# Patient Record
Sex: Male | Born: 1945 | Race: White | Hispanic: No | Marital: Married | Smoking: Never smoker
Health system: Southern US, Community
[De-identification: ages and names within clinical notes are randomized; demographics above are authoritative.]

## PROBLEM LIST (undated history)

## (undated) DIAGNOSIS — I10 Essential (primary) hypertension: Secondary | ICD-10-CM

## (undated) DIAGNOSIS — C8590 Non-Hodgkin lymphoma, unspecified, unspecified site: Secondary | ICD-10-CM

## (undated) HISTORY — PX: TONSILLECTOMY: SUR1361

## (undated) HISTORY — PX: CHOLECYSTECTOMY: SHX55

---

## 2019-02-01 ENCOUNTER — Inpatient Hospital Stay (HOSPITAL_BASED_OUTPATIENT_CLINIC_OR_DEPARTMENT_OTHER)
Admission: EM | Admit: 2019-02-01 | Discharge: 2019-02-08 | DRG: 177 | Disposition: A | Discharge status: Home / Self Care | Payer: Medicare Other | Attending: Internal Medicine | Admitting: Internal Medicine

## 2019-02-01 ENCOUNTER — Emergency Department (HOSPITAL_BASED_OUTPATIENT_CLINIC_OR_DEPARTMENT_OTHER): Payer: Medicare Other

## 2019-02-01 ENCOUNTER — Encounter (HOSPITAL_BASED_OUTPATIENT_CLINIC_OR_DEPARTMENT_OTHER): Payer: Self-pay | Admitting: *Deleted

## 2019-02-01 ENCOUNTER — Other Ambulatory Visit: Payer: Self-pay

## 2019-02-01 DIAGNOSIS — E861 Hypovolemia: Secondary | ICD-10-CM | POA: Diagnosis present

## 2019-02-01 DIAGNOSIS — A419 Sepsis, unspecified organism: Secondary | ICD-10-CM | POA: Diagnosis not present

## 2019-02-01 DIAGNOSIS — Z79899 Other long term (current) drug therapy: Secondary | ICD-10-CM

## 2019-02-01 DIAGNOSIS — C8310 Mantle cell lymphoma, unspecified site: Secondary | ICD-10-CM | POA: Diagnosis present

## 2019-02-01 DIAGNOSIS — U071 COVID-19: Principal | ICD-10-CM | POA: Diagnosis present

## 2019-02-01 DIAGNOSIS — J9601 Acute respiratory failure with hypoxia: Secondary | ICD-10-CM | POA: Diagnosis present

## 2019-02-01 DIAGNOSIS — Z8546 Personal history of malignant neoplasm of prostate: Secondary | ICD-10-CM | POA: Diagnosis not present

## 2019-02-01 DIAGNOSIS — I10 Essential (primary) hypertension: Secondary | ICD-10-CM | POA: Diagnosis present

## 2019-02-01 DIAGNOSIS — J1289 Other viral pneumonia: Secondary | ICD-10-CM | POA: Diagnosis present

## 2019-02-01 DIAGNOSIS — C8590 Non-Hodgkin lymphoma, unspecified, unspecified site: Secondary | ICD-10-CM | POA: Diagnosis not present

## 2019-02-01 DIAGNOSIS — J1282 Pneumonia due to coronavirus disease 2019: Secondary | ICD-10-CM | POA: Diagnosis present

## 2019-02-01 DIAGNOSIS — R0902 Hypoxemia: Secondary | ICD-10-CM

## 2019-02-01 DIAGNOSIS — K219 Gastro-esophageal reflux disease without esophagitis: Secondary | ICD-10-CM | POA: Diagnosis present

## 2019-02-01 HISTORY — DX: Essential (primary) hypertension: I10

## 2019-02-01 HISTORY — DX: Non-Hodgkin lymphoma, unspecified, unspecified site: C85.90

## 2019-02-01 LAB — CBC WITH DIFFERENTIAL/PLATELET
Abs Immature Granulocytes: 0.04 10*3/uL (ref 0.00–0.07)
Basophils Absolute: 0 10*3/uL (ref 0.0–0.1)
Basophils Relative: 0 %
Eosinophils Absolute: 0 10*3/uL (ref 0.0–0.5)
Eosinophils Relative: 0 %
HCT: 35.3 % — ABNORMAL LOW (ref 39.0–52.0)
Hemoglobin: 11.8 g/dL — ABNORMAL LOW (ref 13.0–17.0)
Immature Granulocytes: 1 %
Lymphocytes Relative: 2 %
Lymphs Abs: 0.1 10*3/uL — ABNORMAL LOW (ref 0.7–4.0)
MCH: 31.5 pg (ref 26.0–34.0)
MCHC: 33.4 g/dL (ref 30.0–36.0)
MCV: 94.1 fL (ref 80.0–100.0)
Monocytes Absolute: 0.2 10*3/uL (ref 0.1–1.0)
Monocytes Relative: 4 %
Neutro Abs: 5.9 10*3/uL (ref 1.7–7.7)
Neutrophils Relative %: 93 %
Platelets: 207 10*3/uL (ref 150–400)
RBC: 3.75 MIL/uL — ABNORMAL LOW (ref 4.22–5.81)
RDW: 12.5 % (ref 11.5–15.5)
WBC: 6.3 10*3/uL (ref 4.0–10.5)
nRBC: 0 % (ref 0.0–0.2)

## 2019-02-01 LAB — URINALYSIS, ROUTINE W REFLEX MICROSCOPIC
Bilirubin Urine: NEGATIVE
Glucose, UA: NEGATIVE mg/dL
Hgb urine dipstick: NEGATIVE
Ketones, ur: NEGATIVE mg/dL
Leukocytes,Ua: NEGATIVE
Nitrite: NEGATIVE
Protein, ur: NEGATIVE mg/dL
Specific Gravity, Urine: 1.015 (ref 1.005–1.030)
pH: 5.5 (ref 5.0–8.0)

## 2019-02-01 LAB — PROTIME-INR
INR: 1.1 (ref 0.8–1.2)
Prothrombin Time: 13.7 seconds (ref 11.4–15.2)

## 2019-02-01 LAB — COMPREHENSIVE METABOLIC PANEL
ALT: 141 U/L — ABNORMAL HIGH (ref 0–44)
AST: 59 U/L — ABNORMAL HIGH (ref 15–41)
Albumin: 2.5 g/dL — ABNORMAL LOW (ref 3.5–5.0)
Alkaline Phosphatase: 61 U/L (ref 38–126)
Anion gap: 8 (ref 5–15)
BUN: 20 mg/dL (ref 8–23)
CO2: 25 mmol/L (ref 22–32)
Calcium: 8.5 mg/dL — ABNORMAL LOW (ref 8.9–10.3)
Chloride: 97 mmol/L — ABNORMAL LOW (ref 98–111)
Creatinine, Ser: 1.08 mg/dL (ref 0.61–1.24)
GFR calc Af Amer: >60 mL/min (ref >60)
GFR calc non Af Amer: >60 mL/min (ref >60)
Glucose, Bld: 140 mg/dL — ABNORMAL HIGH (ref 70–99)
Potassium: 4.4 mmol/L (ref 3.5–5.1)
Sodium: 130 mmol/L — ABNORMAL LOW (ref 135–145)
Total Bilirubin: 0.5 mg/dL (ref 0.3–1.2)
Total Protein: 5.6 g/dL — ABNORMAL LOW (ref 6.5–8.1)

## 2019-02-01 LAB — LACTIC ACID, PLASMA
Lactic Acid, Venous: 2.1 mmol/L (ref 0.5–1.9)
Lactic Acid, Venous: 1.3 mmol/L (ref 0.5–1.9)

## 2019-02-01 LAB — TROPONIN I (HIGH SENSITIVITY)
Troponin I (High Sensitivity): 5 ng/L (ref <18)
Troponin I (High Sensitivity): 6 ng/L (ref <18)

## 2019-02-01 LAB — SARS CORONAVIRUS 2 AG (30 MIN TAT): SARS Coronavirus 2 Ag: POSITIVE — AB

## 2019-02-01 LAB — FIBRINOGEN: Fibrinogen: 744 mg/dL — ABNORMAL HIGH (ref 210–475)

## 2019-02-01 LAB — APTT: aPTT: 26 seconds (ref 24–36)

## 2019-02-01 LAB — D-DIMER, QUANTITATIVE: D-Dimer, Quant: 1.35 ug/mL-FEU — ABNORMAL HIGH (ref 0.00–0.50)

## 2019-02-01 LAB — C-REACTIVE PROTEIN: CRP: 8.1 mg/dL — ABNORMAL HIGH (ref <1.0)

## 2019-02-01 LAB — PROCALCITONIN: Procalcitonin: 0.36 ng/mL

## 2019-02-01 LAB — BRAIN NATRIURETIC PEPTIDE: B Natriuretic Peptide: 107.2 pg/mL — ABNORMAL HIGH (ref 0.0–100.0)

## 2019-02-01 MED ORDER — SODIUM CHLORIDE 0.9 % IV BOLUS (SEPSIS)
1000.0000 mL | Freq: Once | INTRAVENOUS | Status: AC
  Administered 2019-02-01: 1000 mL via INTRAVENOUS

## 2019-02-01 MED ORDER — SODIUM CHLORIDE 0.9 % IV SOLN
2.0000 g | Freq: Once | INTRAVENOUS | Status: AC
  Administered 2019-02-01: 2 g via INTRAVENOUS
  Filled 2019-02-01: qty 2

## 2019-02-01 MED ORDER — SODIUM CHLORIDE 0.9 % IV SOLN
INTRAVENOUS | Status: DC | PRN
  Administered 2019-02-01 (×2): 500 mL via INTRAVENOUS

## 2019-02-01 MED ORDER — VANCOMYCIN HCL 10 G IV SOLR
1750.0000 mg | INTRAVENOUS | Status: DC
  Administered 2019-02-02: 1750 mg via INTRAVENOUS
  Filled 2019-02-01 (×3): qty 1750

## 2019-02-01 MED ORDER — VANCOMYCIN HCL IN DEXTROSE 1-5 GM/200ML-% IV SOLN
1000.0000 mg | INTRAVENOUS | Status: AC
  Administered 2019-02-01 (×2): 1000 mg via INTRAVENOUS
  Filled 2019-02-01: qty 200

## 2019-02-01 MED ORDER — SODIUM CHLORIDE 0.9 % IV BOLUS (SEPSIS)
1000.0000 mL | Freq: Once | INTRAVENOUS | Status: AC
  Administered 2019-02-01: 13:00:00 1000 mL via INTRAVENOUS

## 2019-02-01 MED ORDER — SODIUM CHLORIDE 0.9 % IV SOLN
2.0000 g | Freq: Three times a day (TID) | INTRAVENOUS | Status: DC
  Administered 2019-02-01 – 2019-02-03 (×5): 2 g via INTRAVENOUS
  Filled 2019-02-01 (×5): qty 2

## 2019-02-01 MED ORDER — VANCOMYCIN HCL 10 G IV SOLR
2000.0000 mg | Freq: Once | INTRAVENOUS | Status: DC
  Filled 2019-02-01: qty 2000

## 2019-02-01 MED ORDER — SODIUM CHLORIDE 0.9 % IV SOLN
INTRAVENOUS | Status: DC | PRN
  Administered 2019-02-01: 500 mL via INTRAVENOUS

## 2019-02-01 MED ORDER — VANCOMYCIN HCL IN DEXTROSE 1-5 GM/200ML-% IV SOLN: 1000.0000 mg | Freq: Once | INTRAVENOUS | Status: DC

## 2019-02-01 NOTE — ED Provider Notes (Signed)
[facility] EMERGENCY DEPARTMENT Provider Note   CSN: [id number] Arrival date & time: 02/01/19  1157     History   Chief Complaint Chief Complaint  Patient presents with   Covid Positive    HPI Eril Timpson is a 73 y.o. male with a history of non-Hodgkin's lymphoma, hypertension, recent prolonged hospitalization at [facility] for Covid respiratory failure, presented to the emergency department shortness of breath.  Patient reports he was discharged from the hospital 2 L nasal cannula to wear at baseline.  He reports that for the past 2 to 3 days he has noted his oxygen saturation dropping into the 70s despite the oxygen.  He feels extremely short of breath.  He continues to report cough and productive fevers and chills.  He reports a childhood history of asthma but denies any smoking history history of COPD.  He reports allergies to IV contrast and sulfa drugs.  Per care everywhere records from [facility], the patient was discharged home on November 17.  He finished remdesivir course while in the hospital he was discharged on a 10-day course of Decadron which he has been compliant with.  He was also managed for his other chronic comorbidities including the mantle cell lymphoma which was stable during his hospitalization.  The patient reports that all of his doctors at [facility].     HPI  Past Medical History:  Diagnosis Date   Hypertension    Non Hodgkin's lymphoma [facility])     Patient Active Problem List   Diagnosis Date Noted   Pneumonia due to COVID-19 virus 02/01/2019    Past Surgical History:  Procedure Laterality Date   CHOLECYSTECTOMY     TONSILLECTOMY          Home Medications    Prior to Admission medications   Medication Sig Start Date End Date Taking? Authorizing Provider  acetaminophen (TYLENOL) 500 MG tablet Take by mouth.    [provider]  albuterol (VENTOLIN HFA) 108 (90 Base) MCG/ACT inhaler Inhale 2  puffs into the lungs every 4 (four) hours as needed. 01/12/19   [provider]  allopurinol (ZYLOPRIM) 300 MG tablet Take 300 mg by mouth daily. 12/26/18   [provider]  atenolol (TENORMIN) 50 MG tablet  12/11/18   [provider]  atorvastatin (LIPITOR) 10 MG tablet Take 10 mg by mouth daily. 11/25/18   [provider]  diphenoxylate-atropine (LOMOTIL) 2.5-0.025 MG tablet Take by mouth.    [provider]  sertraline (ZOLOFT) 100 MG tablet  12/05/18   [provider]  tamsulosin (FLOMAX) 0.4 MG CAPS capsule Take 0.4 mg by mouth daily. 01/02/19   [provider]    Family History No family history on file.  Social History Social History   Tobacco Use   Smoking status: Never Smoker   Smokeless tobacco: Never Used  Substance Use Topics   Alcohol use: Never    Frequency: Never   Drug use: Never     Allergies   Ivp dye [iodinated diagnostic agents] and Sulfamethoxazole   Review of Systems Review of Systems  Constitutional: Positive for appetite change, chills, fatigue and fever.  Eyes: Negative for pain and visual disturbance.  Respiratory: Positive for cough, chest tightness and shortness of breath.   Cardiovascular: Negative for chest pain and palpitations.  Gastrointestinal: Negative for abdominal pain and vomiting.  Genitourinary: Negative for dysuria and hematuria.  Musculoskeletal: Positive for myalgias. Negative for arthralgias.  Neurological: Negative for syncope  and headaches.  All other systems reviewed and are negative.    Physical Exam Updated Vital Signs BP 108/62 (BP Location: Right Arm)    Pulse 83    Temp (!) 100.8 F (38.2 C) (Oral)    Resp (!) 29    Ht 5\' 11"  (1.803 m)    Wt 86.2 kg    SpO2 98%    BMI 26.50 kg/m   Physical Exam Vitals signs and nursing note reviewed.  Constitutional:      Appearance: He is well-developed.  HENT:     Head: Normocephalic and atraumatic.  Eyes:      Conjunctiva/sclera: Conjunctivae normal.  Neck:     Musculoskeletal: Neck supple.  Cardiovascular:     Rate and Rhythm: Normal rate and regular rhythm.     Pulses: Normal pulses.  Pulmonary:     Effort: Pulmonary effort is normal. Tachypnea present. No accessory muscle usage or respiratory distress.     Breath sounds: Normal breath sounds. No wheezing.     Comments: 80% on room air Some breathlessness with speech RR 32 Abdominal:     Palpations: Abdomen is soft.     Tenderness: There is no abdominal tenderness.  Musculoskeletal:        General: No swelling or tenderness.  Skin:    General: Skin is warm and dry.  Neurological:     Mental Status: He is alert.  Psychiatric:        Mood and Affect: Mood normal.        Behavior: Behavior normal.      ED Treatments / Results  Labs (all labs ordered are listed, but only abnormal results are displayed) Labs Reviewed  SARS CORONAVIRUS 2 AG (30 MIN TAT) - Abnormal; Notable for the following components:      Result Value   SARS Coronavirus 2 Ag POSITIVE (*)    All other components within normal limits  LACTIC ACID, PLASMA - Abnormal; Notable for the following components:   Lactic Acid, Venous 2.1 (*)    All other components within normal limits  COMPREHENSIVE METABOLIC PANEL - Abnormal; Notable for the following components:   Sodium 130 (*)    Chloride 97 (*)    Glucose, Bld 140 (*)    Calcium 8.5 (*)    Total Protein 5.6 (*)    Albumin 2.5 (*)    AST 59 (*)    ALT 141 (*)    All other components within normal limits  CBC WITH DIFFERENTIAL/PLATELET - Abnormal; Notable for the following components:   RBC 3.75 (*)    Hemoglobin 11.8 (*)    HCT 35.3 (*)    Lymphs Abs 0.1 (*)    All other components within normal limits  BRAIN NATRIURETIC PEPTIDE - Abnormal; Notable for the following components:   B Natriuretic Peptide 107.2 (*)    All other components within normal limits  D-DIMER, QUANTITATIVE (NOT AT [facility]) - Abnormal;  Notable for the following components:   D-Dimer, Quant 1.35 (*)    All other components within normal limits  FIBRINOGEN - Abnormal; Notable for the following components:   Fibrinogen 744 (*)    All other components within normal limits  C-REACTIVE PROTEIN - Abnormal; Notable for the following components:   CRP 8.1 (*)    All other components within normal limits  CULTURE, BLOOD (ROUTINE X 2)  CULTURE, BLOOD (ROUTINE X 2)  URINE CULTURE  MRSA PCR SCREENING  LACTIC ACID, PLASMA  APTT  PROTIME-INR  URINALYSIS, ROUTINE W REFLEX MICROSCOPIC  PROCALCITONIN  TROPONIN I (HIGH SENSITIVITY)  TROPONIN I (HIGH SENSITIVITY)    EKG EKG Interpretation  Date/Time:  Monday February 01 2019 12:10:24 EST Ventricular Rate:  85 PR Interval:    QRS Duration: 79 QT Interval:  349 QTC Calculation: 415 R Axis:   12 Text Interpretation: Sinus rhythm Borderline short PR interval Abnormal R-wave progression, early transition No STEMI Confirmed by Octaviano Glow [phone]) on 02/01/2019 12:52:48 PM   Radiology Dg Chest Port 1 View  Result Date: 02/01/2019 CLINICAL DATA:  Shortness of breath, sepsis.  COVID (+) EXAM: PORTABLE CHEST 1 VIEW COMPARISON:  Chest radiograph 01/21/2019, CT chest 01/19/2019 FINDINGS: Unchanged position of a right chest infusion port catheter with tip projecting over the superior cavoatrial junction. Overlying cardiac monitoring leads. Heart size within normal limits. Shallow inspiration radiograph. Again demonstrated are bilateral ill-defined airspace opacities within mid to lower lung predominance. No evidence of pneumothorax or sizable pleural effusion. No acute bony abnormality. IMPRESSION: Redemonstrated bilateral ill-defined airspace opacities with a mid to lower lung predominance. Findings likely reflect multifocal pneumonia given provided history. Electronically Signed   By: Kellie Simmering DO   On: 02/01/2019 12:59    Procedures .Critical Care Performed by: Wyvonnia Dusky, MD Authorized by: Wyvonnia Dusky, MD   Critical care provider statement:    Critical care time (minutes):  45   Critical care was necessary to treat or prevent imminent or life-threatening deterioration of the following conditions:  Sepsis   Critical care was time spent personally by me on the following activities:  Discussions with consultants, evaluation of patient's response to treatment, examination of patient, ordering and performing treatments and interventions, ordering and review of laboratory studies, ordering and review of radiographic studies, pulse oximetry, re-evaluation of patient's condition, obtaining history from patient or surrogate and review of old charts Comments:     Sepsis workup requiring IV fluids, IV antibiotics, supplemental O2   (including critical care time)  Medications Ordered in ED Medications  0.9 %  sodium chloride infusion (500 mLs Intravenous New Bag/Given 02/01/19 1432)  0.9 %  sodium chloride infusion ( Intravenous Stopped 02/01/19 1450)  ceFEPIme (MAXIPIME) 2 g in sodium chloride 0.9 % 100 mL IVPB (has no administration in time range)  vancomycin (VANCOCIN) 1,750 mg in sodium chloride 0.9 % 500 mL IVPB (has no administration in time range)  sodium chloride 0.9 % bolus 1,000 mL (0 mLs Intravenous Stopped 02/01/19 1313)    And  sodium chloride 0.9 % bolus 1,000 mL (0 mLs Intravenous Stopped 02/01/19 1348)    And  sodium chloride 0.9 % bolus 1,000 mL (0 mLs Intravenous Stopped 02/01/19 1459)  ceFEPIme (MAXIPIME) 2 g in sodium chloride 0.9 % 100 mL IVPB ( Intravenous Stopped 02/01/19 1317)  vancomycin (VANCOCIN) IVPB 1000 mg/200 mL premix (0 mg Intravenous Stopped 02/01/19 1541)     Initial Impression / Assessment and Plan / ED Course  I have reviewed the triage vital signs and the nursing notes.  Pertinent labs & imaging results that were available during my care of the patient were reviewed by me and considered in my medical decision making  (see chart for details).  This is a 73 year old gentleman with a history of Covid pneumonia and hospitalization at [facility], who completed a course of remdesivir as well as Decadron, presenting to emergency department with worsening hypoxia and shortness of breath.  The patient does have some breathlessness on exam is tachypneic with respiratory  rate in the low 30s, initially hypoxic to 80% on room air, improved on supplemental nasal cannula O2.  Do not believe he needs BiPAP or intubation at this time.  He appears to be maintaining his airway and his respiratory effort is good.  He is running a fever, he is now well over a month since his diagnosis of Covid.  I do not suspect this is viral but I am concerned instead for superimposed bacterial infection given his hospital course.  Differential includes pneumonia or UTI.  He has no signs of meningitis, no abdominal tenderness to suggest intra-abdominal process.  Will initiate a sepsis work-up.  We will place him on Covid airborne precautions.  I ordered antibiotics for hospital-acquired pneumonia.  Anticipate he will need admission.   Clinical Course as of Jan 31 1750  Mon Feb 01, 2019  1425 Signout given to Dr Maryland Pink at [facility]   [MT]    Clinical Course User Index [MT] Langston Masker Carola Rhine, MD      Final Clinical Impressions(s) / ED Diagnoses   Final diagnoses:  Sepsis, due to unspecified organism, unspecified whether acute organ dysfunction present [facility])  Hypoxia    ED Discharge Orders    None       Wyvonnia Dusky, MD 02/01/19 1751

## 2019-02-01 NOTE — ED Notes (Signed)
Report given to [facility]. 

## 2019-02-01 NOTE — Progress Notes (Signed)
RT heard the sound of air coming from patient's room.  Patients nasal cannula had become detached from the O2 air flow meter and patient's SPO2 had dropped to 87%.  RT reattached cannula to airflow meter.  Patient's SPO2 increased to 97%.

## 2019-02-01 NOTE — ED Triage Notes (Signed)
Covid Positive. SOB. Pale.

## 2019-02-01 NOTE — ED Notes (Signed)
Wife updated on patient status, name/number of [facility] RN given. Wife appreciative.

## 2019-02-01 NOTE — ED Notes (Signed)
His wife can be reached at [phone]

## 2019-02-01 NOTE — ED Notes (Signed)
Pt on monitor 

## 2019-02-01 NOTE — Progress Notes (Signed)
Pharmacy Antibiotic Note  Kirk Montes is a 73 y.o. male admitted on 02/01/2019, COVID-19 positive with SOB.  Pharmacy has been consulted for vancomycin and cefepime dosing for sepsis, suspected source pneumonia. Of note, patient was recently hospitalized at [facility] for respiratory failure due to COVID-19. Lactic acid 2.1. WBC wnl. Tmax 100.8. Scr 1.08 with current CrCl of 63 ml/min. Per Care Everywhere the patient's baseline Scr is approximately 1.1.   Vancomycin 1750 mg IV Q 24 hrs. Goal AUC 400-550. Expected AUC: 498 SCr used: 1.08   Plan: Vancomycin 2000mg  x1 loading dose  Start vancomycin 1750mg  IV q24h (next dose 12/1 at 1330) Stat cefepime 2g IV q8h  Monitor renal function, cultures/sensitivites, and clinical progression  Height: 5\' 11"  (180.3 cm) Weight: 190 lb (86.2 kg) IBW/kg (Calculated) : 75.3  Temp (24hrs), Avg:100.8 F (38.2 C), Min:100.8 F (38.2 C), Max:100.8 F (38.2 C)  No results for input(s): WBC, CREATININE, LATICACIDVEN, VANCOTROUGH, VANCOPEAK, VANCORANDOM, GENTTROUGH, GENTPEAK, GENTRANDOM, TOBRATROUGH, TOBRAPEAK, TOBRARND, AMIKACINPEAK, AMIKACINTROU, AMIKACIN in the last 168 hours.  CrCl cannot be calculated (No successful lab value found.).    Allergies  Allergen Reactions  . Ivp Dye [Iodinated Diagnostic Agents]     unknown  . Sulfamethoxazole Hives    Antimicrobials this admission: Vancomycin 11/30 >> Cefepime 11/30 >>  Dose adjustments this admission: N/A  Microbiology results: 11/30 BCx:  11/30 Ucx:   Thank you for allowing pharmacy to be a part of this patient's care.  Cristela Felt, PharmD PGY1 Pharmacy Resident [employer]: [phone]   02/01/2019 12:33 PM

## 2019-02-02 ENCOUNTER — Encounter (HOSPITAL_COMMUNITY): Payer: Self-pay | Admitting: Internal Medicine

## 2019-02-02 DIAGNOSIS — J9601 Acute respiratory failure with hypoxia: Secondary | ICD-10-CM | POA: Diagnosis present

## 2019-02-02 DIAGNOSIS — U071 COVID-19: Secondary | ICD-10-CM | POA: Diagnosis present

## 2019-02-02 DIAGNOSIS — C8590 Non-Hodgkin lymphoma, unspecified, unspecified site: Secondary | ICD-10-CM | POA: Diagnosis present

## 2019-02-02 DIAGNOSIS — I10 Essential (primary) hypertension: Secondary | ICD-10-CM | POA: Diagnosis present

## 2019-02-02 DIAGNOSIS — J1289 Other viral pneumonia: Secondary | ICD-10-CM

## 2019-02-02 LAB — GASTROINTESTINAL PANEL BY PCR, STOOL (REPLACES STOOL CULTURE)

## 2019-02-02 LAB — URINE CULTURE: Culture: NO GROWTH

## 2019-02-02 LAB — C DIFFICILE QUICK SCREEN W PCR REFLEX
C Diff antigen: NEGATIVE
C Diff interpretation: NOT DETECTED
C Diff toxin: NEGATIVE

## 2019-02-02 LAB — MRSA PCR SCREENING: MRSA by PCR: NEGATIVE

## 2019-02-02 MED ORDER — PANTOPRAZOLE SODIUM 40 MG PO TBEC
40.0000 mg | DELAYED_RELEASE_TABLET | Freq: Two times a day (BID) | ORAL | Status: DC
  Administered 2019-02-02 – 2019-02-08 (×12): 40 mg via ORAL
  Filled 2019-02-02 (×12): qty 1

## 2019-02-02 MED ORDER — TAMSULOSIN HCL 0.4 MG PO CAPS
0.4000 mg | ORAL_CAPSULE | Freq: Every day | ORAL | Status: DC
  Administered 2019-02-02 – 2019-02-07 (×6): 0.4 mg via ORAL
  Filled 2019-02-02 (×6): qty 1

## 2019-02-02 MED ORDER — IPRATROPIUM-ALBUTEROL 20-100 MCG/ACT IN AERS
1.0000 | INHALATION_SPRAY | Freq: Four times a day (QID) | RESPIRATORY_TRACT | Status: DC
  Administered 2019-02-02 – 2019-02-08 (×24): 1 via RESPIRATORY_TRACT
  Filled 2019-02-02: qty 4

## 2019-02-02 MED ORDER — SERTRALINE HCL 50 MG PO TABS
50.0000 mg | ORAL_TABLET | Freq: Every day | ORAL | Status: DC
  Administered 2019-02-03 – 2019-02-08 (×6): 50 mg via ORAL
  Filled 2019-02-02 (×6): qty 1

## 2019-02-02 MED ORDER — HYDROXYZINE HCL 25 MG PO TABS
25.0000 mg | ORAL_TABLET | Freq: Two times a day (BID) | ORAL | Status: DC
  Administered 2019-02-02 – 2019-02-08 (×12): 25 mg via ORAL
  Filled 2019-02-02 (×14): qty 1

## 2019-02-02 MED ORDER — ATENOLOL 25 MG PO TABS
25.0000 mg | ORAL_TABLET | Freq: Two times a day (BID) | ORAL | Status: DC
  Administered 2019-02-03 – 2019-02-08 (×11): 25 mg via ORAL
  Filled 2019-02-02 (×13): qty 1

## 2019-02-02 MED ORDER — ACETAMINOPHEN 325 MG PO TABS
650.0000 mg | ORAL_TABLET | Freq: Four times a day (QID) | ORAL | Status: DC | PRN
  Administered 2019-02-02 – 2019-02-07 (×8): 650 mg via ORAL
  Filled 2019-02-02 (×8): qty 2

## 2019-02-02 MED ORDER — SODIUM CHLORIDE 0.9 % IV SOLN
200.0000 mg | Freq: Once | INTRAVENOUS | Status: DC
  Filled 2019-02-02: qty 40

## 2019-02-02 MED ORDER — ENOXAPARIN SODIUM 40 MG/0.4ML ~~LOC~~ SOLN
40.0000 mg | SUBCUTANEOUS | Status: DC
  Administered 2019-02-02 – 2019-02-07 (×7): 40 mg via SUBCUTANEOUS
  Filled 2019-02-02 (×7): qty 0.4

## 2019-02-02 MED ORDER — DOCUSATE SODIUM 100 MG PO CAPS
100.0000 mg | ORAL_CAPSULE | Freq: Every day | ORAL | Status: DC
  Administered 2019-02-06 – 2019-02-08 (×3): 100 mg via ORAL
  Filled 2019-02-02 (×6): qty 1

## 2019-02-02 MED ORDER — ALLOPURINOL 100 MG PO TABS
300.0000 mg | ORAL_TABLET | Freq: Every day | ORAL | Status: DC
  Administered 2019-02-02 – 2019-02-08 (×7): 300 mg via ORAL
  Filled 2019-02-02 (×6): qty 3

## 2019-02-02 MED ORDER — VITAMIN C 500 MG PO TABS
500.0000 mg | ORAL_TABLET | Freq: Every day | ORAL | Status: DC
  Administered 2019-02-02 – 2019-02-08 (×7): 500 mg via ORAL
  Filled 2019-02-02 (×7): qty 1

## 2019-02-02 MED ORDER — ADULT MULTIVITAMIN W/MINERALS CH
1.0000 | ORAL_TABLET | Freq: Every day | ORAL | Status: DC
  Administered 2019-02-02 – 2019-02-08 (×7): 1 via ORAL
  Filled 2019-02-02 (×7): qty 1

## 2019-02-02 MED ORDER — ALBUTEROL SULFATE HFA 108 (90 BASE) MCG/ACT IN AERS
2.0000 | INHALATION_SPRAY | RESPIRATORY_TRACT | Status: DC | PRN
  Administered 2019-02-05: 2 via RESPIRATORY_TRACT
  Filled 2019-02-02: qty 6.7

## 2019-02-02 MED ORDER — SODIUM CHLORIDE 0.9 % IV SOLN: 100.0000 mg | Freq: Every day | INTRAVENOUS | Status: DC

## 2019-02-02 MED ORDER — ATENOLOL 50 MG PO TABS: 50.0000 mg | ORAL_TABLET | Freq: Two times a day (BID) | ORAL | Status: DC

## 2019-02-02 MED ORDER — GUAIFENESIN-DM 100-10 MG/5ML PO SYRP
10.0000 mL | ORAL_SOLUTION | ORAL | Status: DC | PRN
  Administered 2019-02-03 – 2019-02-07 (×3): 10 mL via ORAL
  Filled 2019-02-02 (×3): qty 10

## 2019-02-02 MED ORDER — ATORVASTATIN CALCIUM 10 MG PO TABS
10.0000 mg | ORAL_TABLET | Freq: Every day | ORAL | Status: DC
  Administered 2019-02-02 – 2019-02-07 (×6): 10 mg via ORAL
  Filled 2019-02-02 (×6): qty 1

## 2019-02-02 MED ORDER — HYDROCOD POLST-CPM POLST ER 10-8 MG/5ML PO SUER
5.0000 mL | Freq: Two times a day (BID) | ORAL | Status: DC | PRN
  Administered 2019-02-03 – 2019-02-06 (×5): 5 mL via ORAL
  Filled 2019-02-02 (×6): qty 5

## 2019-02-02 MED ORDER — DIPHENOXYLATE-ATROPINE 2.5-0.025 MG PO TABS
1.0000 | ORAL_TABLET | Freq: Four times a day (QID) | ORAL | Status: DC | PRN
  Administered 2019-02-03 – 2019-02-07 (×7): 1 via ORAL
  Filled 2019-02-02 (×9): qty 1

## 2019-02-02 MED ORDER — FLUTICASONE PROPIONATE 50 MCG/ACT NA SUSP
2.0000 | Freq: Every day | NASAL | Status: DC
  Administered 2019-02-02 – 2019-02-08 (×7): 2 via NASAL
  Filled 2019-02-02: qty 16

## 2019-02-02 MED ORDER — HYDROXYZINE PAMOATE 25 MG PO CAPS
25.0000 mg | ORAL_CAPSULE | Freq: Two times a day (BID) | ORAL | Status: DC
  Filled 2019-02-02: qty 1

## 2019-02-02 MED ORDER — ZINC SULFATE 220 (50 ZN) MG PO CAPS
220.0000 mg | ORAL_CAPSULE | Freq: Every day | ORAL | Status: DC
  Administered 2019-02-02 – 2019-02-08 (×7): 220 mg via ORAL
  Filled 2019-02-02 (×7): qty 1

## 2019-02-02 MED ORDER — DEXAMETHASONE 6 MG PO TABS
6.0000 mg | ORAL_TABLET | ORAL | Status: DC
  Administered 2019-02-02 – 2019-02-03 (×2): 6 mg via ORAL
  Filled 2019-02-02 (×2): qty 1

## 2019-02-02 NOTE — Progress Notes (Signed)
Patient admitted earlier today.  H&P reviewed.  Patient seen and examined.  Patient states that he continues to have watery stools.  However he also states that he has a chronic history of loose stools for which he takes antimotility agents up to 2-3 times a day.  He came back to the hospital as he was experiencing fevers.  He was recently hospitalized at [facility].  He was treated with remdesivir and steroids there.  Patient is currently hemodynamically stable.  He is on 2 L of oxygen saturating in the mid 90s. Lungs reveal crackles bilaterally at the bases predominantly.  Mildly tachypneic.  No wheezing or rhonchi. S1-S2 is normal regular Abdomen is soft.  Nontender nondistended.  No masses organomegaly No focal neurological deficits.  His C. difficile has come back negative.  GI pathogen panel is pending.  If that is also negative we will put him back on his antimotility agents.  Since abdomen is benign no clear indication to do imaging studies at this time.  Continue management as outlined in the H&P.  We will continue to follow.  Bonnielee Haff

## 2019-02-02 NOTE — H&P (Signed)
TRH H&P   Patient Demographics:    Kirk Montes, is a 73 y.o. male  MRN: [id number]   DOB - [birth year]  Admit Date - 02/01/2019  Outpatient Primary MD for the patient is No primary care provider on file.  Patient coming from: [facility]  Chief Complaint  Patient presents with   Covid Positive     HPI:    Kirk Montes  is a 73 y.o. male, with GERD, HTN, prostate cancer, mantle cell lymphoma, who presents as a transfer from [facility] with progressive shortness of breath.  Tested positive for Covid 12/26/2018, and mild symptoms, was evaluated at [facility] 01/08/2019 for headache and discharged home.  He returned 01/19/2019 with shortness of breath, found to be in acute respiratory failure with hypoxia requiring supplemental oxygen, he was admitted for 7 days.  He was treated with 5-day course of remdesivir and a 10-day course of Decadron.  He was discharged on supplemental oxygen with goal of weaning off slowly.  He was asked to follow-up with his PCP, however he had issues with setting up an appointment, his shortness of breath returned prompting the ER presentation.  In the ER he remained hypoxic, prompting transfer to [place].  He has a cough which is nonproductive.  Denies any hemoptysis.  Denies any chest pain, palpitation, orthopnea or PND.   Review of systems:  Review of Systems:  Constitutional: negative for anorexia, chills, fatigue or fevers HEENT: negative for earaches, epistaxis, or sore throat Respiratory: see HPI Cardiovascular: negative for chest pain, palpitations, or syncope GU: negative for dysuria, urinary frequency, urinary urgency, hematuria Gastrointestinal: negative for abdominal pain,  constipation, diarrhea, nausea or vomiting Musculoskeletal: negative for arthralgias, back pain or myalgias Neurological: negative for dizziness, headaches or weakness Behavioral/Psych: negative for suicidal or homicidal ideation Skin:negative for rash Heme: negative for bruises Endo: negative for hair loss, weight gain/loss  With Past History of the following :   Past Medical History:  Diagnosis Date   Hypertension    Non Hodgkin's lymphoma ([place])       Past Surgical History:  Procedure Laterality Date   CHOLECYSTECTOMY     TONSILLECTOMY       Social History:    Social History   Tobacco Use   Smoking status: Never Smoker   Smokeless tobacco: Never Used  Substance  Use Topics   Alcohol use: Never    Frequency: Never     Family History :    No family history on file.   Home Medications:   Prior to Admission medications   Medication Sig Start Date End Date Taking? Authorizing Provider  acetaminophen (TYLENOL) 500 MG tablet Take by mouth.    [provider]  albuterol (VENTOLIN HFA) 108 (90 Base) MCG/ACT inhaler Inhale 2 puffs into the lungs every 4 (four) hours as needed. 01/12/19   [provider]  allopurinol (ZYLOPRIM) 300 MG tablet Take 300 mg by mouth daily. 12/26/18   [provider]  atenolol (TENORMIN) 50 MG tablet  12/11/18   [provider]  atorvastatin (LIPITOR) 10 MG tablet Take 10 mg by mouth daily. 11/25/18   [provider]  diphenoxylate-atropine (LOMOTIL) 2.5-0.025 MG tablet Take by mouth.    [provider]  sertraline (ZOLOFT) 100 MG tablet  12/05/18   [provider]  tamsulosin (FLOMAX) 0.4 MG CAPS capsule Take 0.4 mg by mouth daily. 01/02/19   [provider]     Allergies:     Allergies  Allergen Reactions   Ivp Dye [Iodinated Diagnostic Agents]     unknown   Sulfamethoxazole Hives     Physical Exam:   Vitals  Blood pressure 137/66, pulse 66, temperature  98.4 F (36.9 C), temperature source Oral, resp. rate 18, height 5\' 11"  (1.803 m), weight 86.2 kg, SpO2 97 %.  Physical Exam   Constitutional - resting comfortably, no acute distress Eyes - pupils equal round and reactive to light and accomodation, extra ocular movements intact Nose - no gross deformity or drainage Mouth - no oral lesions noted Throat - no swelling or erythema Neck - supple, no JVD   CV - (+)S1S2, no murmurs  Resp -bilateral lower lung field rales,  GI - (+)BS, soft, non-tender, non-distended Extrem - no clubbing, cyanosis, or peripheral edema  Skin - no rashes or wounds Neuro - alert, aware, oriented to person/place/time  Psych - normal affect, no anxiety   Patient has Pressure Ulcer on Admission?: no   Data Review:    CBC Recent Labs  Lab 02/01/19 1230  WBC 6.3  HGB 11.8*  HCT 35.3*  PLT 207  MCV 94.1  MCH 31.5  MCHC 33.4  RDW 12.5  LYMPHSABS 0.1*  MONOABS 0.2  EOSABS 0.0  BASOSABS 0.0   ------------------------------------------------------------------------------------------------------------------  Chemistries  Recent Labs  Lab 02/01/19 1230  NA 130*  K 4.4  CL 97*  CO2 25  GLUCOSE 140*  BUN 20  CREATININE 1.08  CALCIUM 8.5*  AST 59*  ALT 141*  ALKPHOS 61  BILITOT 0.5   ------------------------------------------------------------------------------------------------------------------ estimated creatinine clearance is 62.9 mL/min (by C-G formula based on SCr of 1.08 mg/dL). ------------------------------------------------------------------------------------------------------------------ No results for input(s): TSH, T4TOTAL, T3FREE, THYROIDAB in the last 72 hours.  Invalid input(s): FREET3  Coagulation profile Recent Labs  Lab 02/01/19 1230  INR 1.1   ------------------------------------------------------------------------------------------------------------------- Recent Labs    02/01/19 1230  DDIMER 1.35*    -------------------------------------------------------------------------------------------------------------------  Cardiac Enzymes No results for input(s): CKMB, TROPONINI, MYOGLOBIN in the last 168 hours.  Invalid input(s): CK ------------------------------------------------------------------------------------------------------------------    Component Value Date/Time   BNP 107.2 (H) 02/01/2019 1230     ---------------------------------------------------------------------------------------------------------------  Urinalysis    Component Value Date/Time   COLORURINE YELLOW 02/01/2019 1230   APPEARANCEUR CLEAR 02/01/2019 1230   LABSPEC 1.015 02/01/2019 1230   PHURINE 5.5 02/01/2019 1230   GLUCOSEU NEGATIVE  02/01/2019 [place] 02/01/2019 [place] 02/01/2019 [place] 02/01/2019 [place] 02/01/2019 1230   NITRITE NEGATIVE 02/01/2019 [place] 02/01/2019 1230    ----------------------------------------------------------------------------------------------------------------   Imaging Results:    Dg Chest Port 1 View  Result Date: 02/01/2019 CLINICAL DATA:  Shortness of breath, sepsis.  COVID (+) EXAM: PORTABLE CHEST 1 VIEW COMPARISON:  Chest radiograph 01/21/2019, CT chest 01/19/2019 FINDINGS: Unchanged position of a right chest infusion port catheter with tip projecting over the superior cavoatrial junction. Overlying cardiac monitoring leads. Heart size within normal limits. Shallow inspiration radiograph. Again demonstrated are bilateral ill-defined airspace opacities within mid to lower lung predominance. No evidence of pneumothorax or sizable pleural effusion. No acute bony abnormality. IMPRESSION: Redemonstrated bilateral ill-defined airspace opacities with a mid to lower lung predominance. Findings likely reflect multifocal pneumonia given provided history. Electronically Signed   By:  Kellie Simmering DO   On: 02/01/2019 12:59    Assessment & Plan:    Principal Problem:   Acute hypoxemic respiratory failure due to severe acute respiratory syndrome coronavirus 2 (SARS-CoV-2) disease ([place]) Active Problems:   Pneumonia due to COVID-19 virus   Hypertension   Non Hodgkin's lymphoma ([place])    Acute hypoxemic respiratory failure due to SARS-CoV-2 disease/Pneumonia due to COVID-19 virus: Patient initially tested positive for COVID-19 on 12/26/2018, had progressive symptoms and was admitted 11/17-11/24/2020 with hypoxic respiratory failure, treated with remdesivir and Decadron, discharged on 2 L [place] O2.  Returns with progressive shortness of breath, cough repeat COVID-19 test positive. -Patient was started on cefepime and vancomycin which will be continued while awaiting procalcitonin and CBC results. -Clinically appears euvolemic to hypovolemic, will hold off diuresis for now -Placey on steroids, incentive spirometry, supplemental oxygen and wean as tolerated. -He has been having diarrhea, sent for GI PCR panel, rule out C. difficile.  Consider CT abdomen pelvis.      Hypertension: Blood pressures above but close to goal.  Resume his home medications once verified by pharmacy.    Non Hodgkin's lymphoma: Continue outpatient follow-up with oncology  DVT ProphylaxisLovenox  AM Labs Ordered, also please review Full Orders  Family Communication: Admission, patients condition and plan of care including tests being ordered have been discussed with the patient who indicate understanding and agree with the plan and Code Status.  Code Status full  Likely DC to home  Condition GUARDED    Consults called: None  Admission status: Admit to inpatient  Time spent in minutes : 55   Peyton Bottoms M.D on 02/02/2019 at 4:55 AM  To page go to [email or web address] - password [facility]

## 2019-02-02 NOTE — Progress Notes (Signed)
Received from [facility] on stretcher via EMS to room 158. Assisted to bed and positioned for comfort. Oriented to room, bed and unit. In no acute respiratory distress.

## 2019-02-03 DIAGNOSIS — J9601 Acute respiratory failure with hypoxia: Secondary | ICD-10-CM

## 2019-02-03 DIAGNOSIS — A419 Sepsis, unspecified organism: Secondary | ICD-10-CM

## 2019-02-03 DIAGNOSIS — I10 Essential (primary) hypertension: Secondary | ICD-10-CM

## 2019-02-03 LAB — COMPREHENSIVE METABOLIC PANEL
ALT: 109 U/L — ABNORMAL HIGH (ref 0–44)
AST: 36 U/L (ref 15–41)
Albumin: 2.7 g/dL — ABNORMAL LOW (ref 3.5–5.0)
Alkaline Phosphatase: 59 U/L (ref 38–126)
Anion gap: 7 (ref 5–15)
BUN: 22 mg/dL (ref 8–23)
CO2: 25 mmol/L (ref 22–32)
Calcium: 8.7 mg/dL — ABNORMAL LOW (ref 8.9–10.3)
Chloride: 100 mmol/L (ref 98–111)
Creatinine, Ser: 1.03 mg/dL (ref 0.61–1.24)
GFR calc Af Amer: >60 mL/min (ref >60)
GFR calc non Af Amer: >60 mL/min (ref >60)
Glucose, Bld: 127 mg/dL — ABNORMAL HIGH (ref 70–99)
Potassium: 4.6 mmol/L (ref 3.5–5.1)
Sodium: 132 mmol/L — ABNORMAL LOW (ref 135–145)
Total Bilirubin: 0.7 mg/dL (ref 0.3–1.2)
Total Protein: 6.2 g/dL — ABNORMAL LOW (ref 6.5–8.1)

## 2019-02-03 LAB — CBC WITH DIFFERENTIAL/PLATELET
Abs Immature Granulocytes: 0.05 10*3/uL (ref 0.00–0.07)
Basophils Absolute: 0 10*3/uL (ref 0.0–0.1)
Basophils Relative: 0 %
Eosinophils Absolute: 0 10*3/uL (ref 0.0–0.5)
Eosinophils Relative: 0 %
HCT: 34.9 % — ABNORMAL LOW (ref 39.0–52.0)
Hemoglobin: 11.5 g/dL — ABNORMAL LOW (ref 13.0–17.0)
Immature Granulocytes: 1 %
Lymphocytes Relative: 2 %
Lymphs Abs: 0.1 10*3/uL — ABNORMAL LOW (ref 0.7–4.0)
MCH: 31.7 pg (ref 26.0–34.0)
MCHC: 33 g/dL (ref 30.0–36.0)
MCV: 96.1 fL (ref 80.0–100.0)
Monocytes Absolute: 0.2 10*3/uL (ref 0.1–1.0)
Monocytes Relative: 3 %
Neutro Abs: 6.4 10*3/uL (ref 1.7–7.7)
Neutrophils Relative %: 94 %
Platelets: 181 10*3/uL (ref 150–400)
RBC: 3.63 MIL/uL — ABNORMAL LOW (ref 4.22–5.81)
RDW: 12.6 % (ref 11.5–15.5)
WBC: 6.8 10*3/uL (ref 4.0–10.5)
nRBC: 0 % (ref 0.0–0.2)

## 2019-02-03 LAB — C-REACTIVE PROTEIN: CRP: 11.6 mg/dL — ABNORMAL HIGH (ref <1.0)

## 2019-02-03 LAB — FERRITIN: Ferritin: 960 ng/mL — ABNORMAL HIGH (ref 24–336)

## 2019-02-03 LAB — D-DIMER, QUANTITATIVE: D-Dimer, Quant: 1.18 ug/mL-FEU — ABNORMAL HIGH (ref 0.00–0.50)

## 2019-02-03 MED ORDER — SODIUM CHLORIDE 0.9 % IV SOLN
100.0000 mg | INTRAVENOUS | Status: DC
  Administered 2019-02-04 – 2019-02-06 (×3): 100 mg via INTRAVENOUS
  Filled 2019-02-03: qty 20
  Filled 2019-02-03: qty 100
  Filled 2019-02-03: qty 20

## 2019-02-03 MED ORDER — SODIUM CHLORIDE 0.9 % IV SOLN
200.0000 mg | Freq: Once | INTRAVENOUS | Status: AC
  Administered 2019-02-03: 200 mg via INTRAVENOUS
  Filled 2019-02-03: qty 40

## 2019-02-03 NOTE — Plan of Care (Signed)
Teaching continued with patient this morning. All questions answered at this time. Discharge planning ongoing. VSS at this time. Afebrile. IV abx d/c'd by team. Pharmacy consulted for remdesivir dosing. Remains on [facility] at this time. LS diminished at bases bilaterally. Patient OOB to [facility] frequently, tolerating this activity well. Adequate appetite, tolerating POs well. Vdg in urinal/BSC. +hyperactive BS, frequent loose BM, lomotil PRN. Patient reports this is his baseline. No c/o pain at this time. Safe environment of care maintained. Skin intact. Will continue to monitor.

## 2019-02-03 NOTE — Progress Notes (Signed)
[facility] PROGRESS NOTE    Progress Note  Kirk Montes  QBH:[id number] DOB: 01-29-1946 DOA: 02/01/2019 PCP: No primary care provider on file.     Brief Narrative:   Kirk Montes is an 73 y.o. male past medical history of GERD, essential hypertension, prostate cancer and mantle cell lymphoma presents from [place] with progressive shortness of breath, he tested positive for COVID-19 on 12/26/2018 during this time he had mild symptoms.  Was evaluated at [facility] regional on 01/08/2019 for headache and discharged home.  Return point regional on 01/19/2019 for shortness of breath and was found to be hypoxic on supplemental oxygen.  Was admitted for 7 days he was treated with a 5-day course of IV remdesivir and Decadron for 10 days.  He was discharged home with supplemental oxygen with goals to wean off oxygen as an outpatient.  He returns on the day of admission on 02/12/2019 to the ER for worsening shortness of breath, he remained hypoxic and was transferred to [facility].  He has now nonproductive cough  Assessment/Plan:   Acute hypoxemic respiratory failure due to severe acute respiratory syndrome coronavirus 2 (SARS-CoV-2) disease (HCC)/ Pneumonia due to COVID-19 virus Patient tested positive for SARS-CoV-2 on 01/26/2019 his condition progress and was admitted to [facility] on 01/18/2021 01/26/2019 with respiratory failure, he was treated with 5-day course of IV remdesivir and 10 day course of steroids.  Was discharged home on 2 L of oxygen. He returned with progressive shortness of breath and persistent cough, he continued test positive for COVID-19.  It is to note that he does have mantle cell lymphoma He was started empirically on IV vancomycin and cefepime, his procalcitonin was 0.36. He has already completed a 10-day course of IV steroids the original study was done on patient with COVID-19 and the treatment was no longer than 10 days. On  admission he appeared to be euvolemic to hypovolemic so diuresis was held. Continues to requires 2 L of oxygen to keep saturations greater than 95%. We will discontinue IV vancomycin and cefepime. His inflammatory markers are trending up, will go ahead and restart him on IV remdesivir for 5 days, continue IV Decadron.  Nonbloody diarrhea: C. difficile PCR was negative. GI PCR panel is pending.  Essential hypertension Continue atenolol. Blood pressure is fairly controlled.  Non Hodgkin's lymphoma ([place]) mantle cell lymphoma: Noted will need to follow-up with PCP as an outpatient.   DVT prophylaxis: lovenox Family Communication:none Disposition Plan/Barrier to D/C: unable to determine Code Status:     Code Status Orders  (From admission, onward)         Start     Ordered   02/02/19 0004  Full code  Continuous     02/02/19 0005        Code Status History    This patient has a current code status but no historical code status.   Advance Care Planning Activity    Advance Directive Documentation     Most Recent Value  Type of Advance Directive  Healthcare Power of Attorney  Pre-existing out of facility DNR order (yellow form or pink MOST form)  -  "MOST" Form in Place?  -        IV Access:    Peripheral IV   Procedures and diagnostic studies:   Dg Chest Port 1 View  Result Date: 02/01/2019 CLINICAL DATA:  Shortness of breath, sepsis.  COVID (+) EXAM: PORTABLE CHEST 1 VIEW COMPARISON:  Chest  radiograph 01/21/2019, CT chest 01/19/2019 FINDINGS: Unchanged position of a right chest infusion port catheter with tip projecting over the superior cavoatrial junction. Overlying cardiac monitoring leads. Heart size within normal limits. Shallow inspiration radiograph. Again demonstrated are bilateral ill-defined airspace opacities within mid to lower lung predominance. No evidence of pneumothorax or sizable pleural effusion. No acute bony abnormality. IMPRESSION:  Redemonstrated bilateral ill-defined airspace opacities with a mid to lower lung predominance. Findings likely reflect multifocal pneumonia given provided history. Electronically Signed   By: Kellie Simmering DO   On: 02/01/2019 12:59     Medical Consultants:    None.  Anti-Infectives:   IV remdesivir. Subjective:    [name] he relates his breathing is about the same unchanged from yesterday.  Objective:    Vitals:   02/02/19 2029 02/03/19 0000 02/03/19 0004 02/03/19 0348  BP: (!) 156/89 (!) 156/86 (!) 156/81 (!) 163/84  Pulse: 69 76 74 84  Resp: (!) '22 20 19 20  ' Temp: 98.2 F (36.8 C)  97.7 F (36.5 C) 98.8 F (37.1 C)  TempSrc: Oral  Oral Oral  SpO2: 99% 96% 97% 97%  Weight:      Height:       SpO2: 97 % O2 Flow Rate (L/min): 2 L/min   Intake/Output Summary (Last 24 hours) at 02/03/2019 0802 Last data filed at 02/03/2019 0700 Gross per 24 hour  Intake 1232.74 ml  Output 1279 ml  Net -46.26 ml   Filed Weights   02/01/19 1200  Weight: 86.2 kg    Exam: General exam: In no acute distress. Respiratory system: Good air movement and diffuse crackles bilaterally. Cardiovascular system: S1 & S2 heard, RRR. No JVD. Gastrointestinal system: Abdomen is nondistended, soft and nontender.  Central nervous system: Alert and oriented. No focal neurological deficits. Extremities: No pedal edema. Skin: No rashes, lesions or ulcers Psychiatry: Judgement and insight appear normal. Mood & affect appropriate.    Data Reviewed:    Labs: Basic Metabolic Panel: Recent Labs  Lab 02/01/19 1230 02/03/19 0330  NA 130* 132*  K 4.4 4.6  CL 97* 100  CO2 25 25  GLUCOSE 140* 127*  BUN 20 22  CREATININE 1.08 1.03  CALCIUM 8.5* 8.7*   GFR Estimated Creatinine Clearance: 66 mL/min (by C-G formula based on SCr of 1.03 mg/dL). Liver Function Tests: Recent Labs  Lab 02/01/19 1230 02/03/19 0330  AST 59* 36  ALT 141* 109*  ALKPHOS 61 59  BILITOT 0.5 0.7  PROT 5.6*  6.2*  ALBUMIN 2.5* 2.7*   No results for input(s): LIPASE, AMYLASE in the last 168 hours. No results for input(s): AMMONIA in the last 168 hours. Coagulation profile Recent Labs  Lab 02/01/19 1230  INR 1.1   COVID-19 Labs  Recent Labs    02/01/19 1230 02/03/19 0330  DDIMER 1.35* 1.18*  FERRITIN  --  960*  CRP 8.1* 11.6*    No results found for: SARSCOV2NAA  CBC: Recent Labs  Lab 02/01/19 1230 02/03/19 0330  WBC 6.3 6.8  NEUTROABS 5.9 6.4  HGB 11.8* 11.5*  HCT 35.3* 34.9*  MCV 94.1 96.1  PLT 207 181   Cardiac Enzymes: No results for input(s): CKTOTAL, CKMB, CKMBINDEX, TROPONINI in the last 168 hours. BNP (last 3 results) No results for input(s): PROBNP in the last 8760 hours. CBG: No results for input(s): GLUCAP in the last 168 hours. D-Dimer: Recent Labs    02/01/19 1230 02/03/19 0330  DDIMER 1.35* 1.18*   Hgb A1c: No results  for input(s): HGBA1C in the last 72 hours. Lipid Profile: No results for input(s): CHOL, HDL, LDLCALC, TRIG, CHOLHDL, LDLDIRECT in the last 72 hours. Thyroid function studies: No results for input(s): TSH, T4TOTAL, T3FREE, THYROIDAB in the last 72 hours.  Invalid input(s): FREET3 Anemia work up: Recent Labs    02/03/19 0330  FERRITIN 960*   Sepsis Labs: Recent Labs  Lab 02/01/19 1230 02/01/19 1430 02/03/19 0330  PROCALCITON 0.36  --   --   WBC 6.3  --  6.8  LATICACIDVEN 2.1* 1.3  --    Microbiology Recent Results (from the past 240 hour(s))  Blood Culture (routine x 2)     Status: None (Preliminary result)   Collection Time: 02/01/19 12:30 PM   Specimen: BLOOD  Result Value Ref Range Status   Specimen Description   Final    BLOOD LEFT ANTECUBITAL Performed at [facility], [address] [place], [place] [address]    Special Requests   Final    BOTTLES DRAWN AEROBIC AND ANAEROBIC Blood Culture adequate volume Performed at [facility], [address] [place], [place] [address]     Culture   Final    NO GROWTH 2 DAYS Performed at [facility], [place] [address], [place], [place] [address]    Report Status PENDING  Incomplete  Urine culture     Status: None   Collection Time: 02/01/19 12:30 PM   Specimen: In/Out Cath Urine  Result Value Ref Range Status   Specimen Description   Final    IN/OUT CATH URINE Performed at [facility], [address] [place], [place] [address]    Special Requests   Final    NONE Performed at [facility], [address] [place], [place] [address]    Culture   Final    NO GROWTH Performed at [facility], [place] [address] [place], [place] [address]    Report Status 02/02/2019 FINAL  Final  SARS Coronavirus 2 Ag (30 min TAT) - Nasal Swab (BD Veritor Kit)     Status: Abnormal   Collection Time: 02/01/19 12:30 PM   Specimen: Nasal Swab (BD Veritor Kit)  Result Value Ref Range Status   SARS Coronavirus 2 Ag POSITIVE (A) NEGATIVE Final    Comment: RESULT CALLED TO, READ BACK BY AND VERIFIED WITH: CALLED TO S.COBLE RN AT 0347 ON [id number] BY SROY (NOTE) SARS-CoV-2 antigen PRESENT. Positive results indicate the presence of viral antigens, but clinical correlation with patient history and other diagnostic information is necessary to determine patient infection status.  Positive results do not rule out bacterial infection or co-infection  with other viruses. False positive results are rare but can occur, and confirmatory RT-PCR testing may be appropriate in some circumstances. The expected result is Negative. Fact Sheet for Patients: PodPark.tn Fact Sheet for Providers: GiftContent.is  This test is not yet approved or cleared by the [place] FDA and  has been authorized for detection and/or diagnosis of SARS-CoV-2 by FDA under an Emergency Use Authorization (EUA).  This EUA will remain in effect (meaning this test can be used) for the duration  of  the COVID -19 declaration under Section 564(b)(1) of the Act, 21 U.S.C. section 360bbb-3(b)(1), unless the authorization is terminated or revoked sooner. Performed at [facility], [address] [place], [place] [address]   Blood Culture (routine x 2)     Status: None (  Preliminary result)   Collection Time: 02/01/19 12:31 PM   Specimen: BLOOD  Result Value Ref Range Status   Specimen Description   Final    BLOOD RIGHT ANTECUBITAL Performed at [facility], [address] [place], [place] [address]    Special Requests   Final    BOTTLES DRAWN AEROBIC AND ANAEROBIC Blood Culture adequate volume Performed at [facility], [address] [place], [place] [address]    Culture   Final    NO GROWTH 2 DAYS Performed at [facility], [place] [address] [place], [place] [address]    Report Status PENDING  Incomplete  C difficile quick scan w PCR reflex     Status: None   Collection Time: 02/02/19  6:55 AM   Specimen: STOOL  Result Value Ref Range Status   C Diff antigen NEGATIVE NEGATIVE Final   C Diff toxin NEGATIVE NEGATIVE Final   C Diff interpretation No C. difficile detected.  Final    Comment: Performed at [facility], [place] [address] [place], [place] [address]  Gastrointestinal Panel by PCR , Stool     Status: None   Collection Time: 02/02/19  6:55 AM   Specimen: Stool  Result Value Ref Range Status   Campylobacter species NOT DETECTED NOT DETECTED Final   Plesimonas shigelloides NOT DETECTED NOT DETECTED Final   Salmonella species NOT DETECTED NOT DETECTED Final   Yersinia enterocolitica NOT DETECTED NOT DETECTED Final   Vibrio species NOT DETECTED NOT DETECTED Final   Vibrio cholerae NOT DETECTED NOT DETECTED Final   Enteroaggregative E coli (EAEC) NOT DETECTED NOT DETECTED Final   Enteropathogenic E coli (EPEC) NOT DETECTED NOT DETECTED Final   Enterotoxigenic E coli (ETEC) NOT DETECTED NOT DETECTED Final    Shiga like toxin producing E coli (STEC) NOT DETECTED NOT DETECTED Final   Shigella/Enteroinvasive E coli (EIEC) NOT DETECTED NOT DETECTED Final   Cryptosporidium NOT DETECTED NOT DETECTED Final   Cyclospora cayetanensis NOT DETECTED NOT DETECTED Final   Entamoeba histolytica NOT DETECTED NOT DETECTED Final   Giardia lamblia NOT DETECTED NOT DETECTED Final   Adenovirus F40/41 NOT DETECTED NOT DETECTED Final   Astrovirus NOT DETECTED NOT DETECTED Final   Norovirus GI/GII NOT DETECTED NOT DETECTED Final   Rotavirus A NOT DETECTED NOT DETECTED Final   Sapovirus (I, II, IV, and V) NOT DETECTED NOT DETECTED Final    Comment: Performed at [facility], [address] [place], [place] [address]  MRSA PCR Screening     Status: None   Collection Time: 02/02/19  3:00 PM   Specimen: Nasopharyngeal  Result Value Ref Range Status   MRSA by PCR NEGATIVE NEGATIVE Final    Comment:        The GeneXpert MRSA Assay (FDA approved for NASAL specimens only), is one component of a comprehensive MRSA colonization surveillance program. It is not intended to diagnose MRSA infection nor to guide or monitor treatment for MRSA infections. Performed at [facility], [place] [address] [place], [place] [address]      Medications:   . allopurinol  300 mg Oral Daily  . atenolol  25 mg Oral BID  . atorvastatin  10 mg Oral q1800  . dexamethasone  6 mg Oral Q24H  . docusate sodium  100 mg Oral Daily  . enoxaparin (LOVENOX) injection  40 mg Subcutaneous Q24H  . fluticasone  2 spray Each Nare Daily  .  hydrOXYzine  25 mg Oral BID  . Ipratropium-Albuterol  1 puff Inhalation Q6H  . multivitamin with minerals  1 tablet Oral Daily  . pantoprazole  40 mg Oral BID  . sertraline  50 mg Oral Daily  . tamsulosin  0.4 mg Oral QPC supper  . vitamin C  500 mg Oral Daily  . zinc sulfate  220 mg Oral Daily   Continuous Infusions: . sodium chloride Stopped (02/01/19 2123)  . sodium chloride  Stopped (02/01/19 1450)  . ceFEPime (MAXIPIME) IV 2 g (02/03/19 0401)  . vancomycin 1,750 mg (02/02/19 1500)      LOS: 2 days   Charlynne Cousins  [facility]  02/03/2019, 8:02 AM

## 2019-02-03 NOTE — Progress Notes (Signed)
Called and spoke with patient's wife Izora Gala to provide update on his status. All questions answered at this time.

## 2019-02-04 LAB — CBC WITH DIFFERENTIAL/PLATELET
Abs Immature Granulocytes: 0.04 10*3/uL (ref 0.00–0.07)
Basophils Absolute: 0 10*3/uL (ref 0.0–0.1)
Basophils Relative: 0 %
Eosinophils Absolute: 0 10*3/uL (ref 0.0–0.5)
Eosinophils Relative: 0 %
HCT: 32.9 % — ABNORMAL LOW (ref 39.0–52.0)
Hemoglobin: 10.8 g/dL — ABNORMAL LOW (ref 13.0–17.0)
Immature Granulocytes: 1 %
Lymphocytes Relative: 3 %
Lymphs Abs: 0.2 10*3/uL — ABNORMAL LOW (ref 0.7–4.0)
MCH: 30.9 pg (ref 26.0–34.0)
MCHC: 32.8 g/dL (ref 30.0–36.0)
MCV: 94.3 fL (ref 80.0–100.0)
Monocytes Absolute: 0.3 10*3/uL (ref 0.1–1.0)
Monocytes Relative: 5 %
Neutro Abs: 5.4 10*3/uL (ref 1.7–7.7)
Neutrophils Relative %: 91 %
Platelets: 191 10*3/uL (ref 150–400)
RBC: 3.49 MIL/uL — ABNORMAL LOW (ref 4.22–5.81)
RDW: 12.8 % (ref 11.5–15.5)
WBC: 5.8 10*3/uL (ref 4.0–10.5)
nRBC: 0 % (ref 0.0–0.2)

## 2019-02-04 LAB — COMPREHENSIVE METABOLIC PANEL
ALT: 78 U/L — ABNORMAL HIGH (ref 0–44)
AST: 27 U/L (ref 15–41)
Albumin: 2.5 g/dL — ABNORMAL LOW (ref 3.5–5.0)
Alkaline Phosphatase: 57 U/L (ref 38–126)
Anion gap: 9 (ref 5–15)
BUN: 23 mg/dL (ref 8–23)
CO2: 25 mmol/L (ref 22–32)
Calcium: 8.5 mg/dL — ABNORMAL LOW (ref 8.9–10.3)
Chloride: 96 mmol/L — ABNORMAL LOW (ref 98–111)
Creatinine, Ser: 1.06 mg/dL (ref 0.61–1.24)
GFR calc Af Amer: >60 mL/min (ref >60)
GFR calc non Af Amer: >60 mL/min (ref >60)
Glucose, Bld: 82 mg/dL (ref 70–99)
Potassium: 4.1 mmol/L (ref 3.5–5.1)
Sodium: 130 mmol/L — ABNORMAL LOW (ref 135–145)
Total Bilirubin: 0.8 mg/dL (ref 0.3–1.2)
Total Protein: 5.9 g/dL — ABNORMAL LOW (ref 6.5–8.1)

## 2019-02-04 LAB — D-DIMER, QUANTITATIVE: D-Dimer, Quant: 0.96 ug/mL-FEU — ABNORMAL HIGH (ref 0.00–0.50)

## 2019-02-04 LAB — C-REACTIVE PROTEIN: CRP: 9.9 mg/dL — ABNORMAL HIGH (ref <1.0)

## 2019-02-04 LAB — FERRITIN: Ferritin: 1193 ng/mL — ABNORMAL HIGH (ref 24–336)

## 2019-02-04 NOTE — Progress Notes (Signed)
Called and spoke with patient's wife Izora Gala to provide an update on patient's status. All questions answered at this time.

## 2019-02-04 NOTE — Plan of Care (Signed)
Teaching continued with patient this morning, continue teaching w/family via phone when available. All patient's questions answered. Patient with stable VS. Had one epidode of SVT xapprox 10 beats this AM, RN notified by CCMD tele monitor of this event. Discharge planning ongoing, but patient not appropriate for d/c at this time. Continuing with 2nd round of remdesivir dosed by pharmacy. Encouraging OOB to chair, patient independent with mobility. Stays in chair for very short periods before getting back to bed independently. Teaching done regarding importance of getting OOB. Tolerating POs well with adequate appetite. Vdg without difficulty in urinal/BSC. +BS/LBM 12/3. Lomotil given this AM per patient's request. Emotional support provided to patient. No c/o pain. Safe environment of care maintained. Requiring 2LNC at rest to maintain SpO2. Patient desat to mid 38s on RA and becomes very SOB with even minimal activity. Will continue to monitor and plan to wean O2 as patient tolerates.

## 2019-02-04 NOTE — Progress Notes (Signed)
[facility] PROGRESS NOTE    Progress Note  Kirk Montes  PTW:[id number] DOB: 1945/11/05 DOA: 02/01/2019 PCP: Montes primary care provider on file.     Brief Narrative:   Kirk Montes is an 73 y.o. male past medical history of GERD, essential hypertension, prostate cancer and mantle cell lymphoma presents from [place] with progressive shortness of breath, he tested positive for COVID-19 on 12/26/2018 during this time he had mild symptoms and discharged home.  Was re-evaluated at [facility] on 01/08/2019 for headache and discharged home.  Returns to [facility] on 01/19/2019 for shortness of breath and was found to be hypoxic on supplemental oxygen.  Was admitted for 7 days he was treated with a 5-day course of IV remdesivir and Decadron for 10 days.  He was discharged home with supplemental oxygen with goals to wean off oxygen as an outpatient.  He returns on the day of admission on 02/12/2019 to the ER for worsening shortness of breath, he remained hypoxic and was transferred to [facility].  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 Procalcitonin was low yield, antibiotic regimen was discontinued. Has already completed 10-day course of IV steroids, as he still requiring oxygen he was started on IV remdesivir. Admission he appeared euvolemic and diuretics will help. He is still requiring 2 L of oxygen to keep saturations greater than 95%.  When I went in the room he was without his oxygen satting greater than 88%.  Ambulate and check saturations with ambulation tomorrow. Inflammatory markers are improved this morning.  Nonbloody diarrhea: C. difficile PCR was negative, GI PCR panel is pending.  Essential hypertension Continue  atenolol. Blood pressure is fairly controlled.  Non Hodgkin's lymphoma ([place]) mantle cell lymphoma: Noted will need to follow-up with PCP as an outpatient.   DVT prophylaxis: lovenox Family Communication:none Disposition Plan/Barrier to D/C: unable to determine Code Status:     Code Status Orders  (From admission, onward)         Start     Ordered   02/02/19 0004  Full code  Continuous     02/02/19 0005        Code Status History    This patient has a current code status but Montes historical code status.   Advance Care Planning Activity    Advance Directive Documentation     Most Recent Value  Type of Advance Directive  Healthcare Power of Attorney  Pre-existing out of facility DNR order (yellow form or pink MOST form)  -  "MOST" Form in Place?  -        IV Access:    Peripheral IV   Procedures and diagnostic studies:   Montes results found.   Medical Consultants:    None.  Anti-Infectives:   IV remdesivir. Subjective:    [name] he relates his breathing is about the same, unchanged from yesterday.  Objective:    Vitals:   02/03/19 2000 02/03/19 2200 02/04/19 0000 02/04/19 0500  BP: 125/73  121/72 130/80  Pulse: 93  73 74  Resp: (!) 21  (!) 21 (!) 22  Temp: (!) 103 F (39.4 C) (!) 101.7 F (38.7 C) 98.8 F (37.1 C) 98.4 F (36.9 C)  TempSrc: Oral   Oral  SpO2: 95%  95% 99%  Weight:      Height:       SpO2: 99 % O2 Flow Rate (L/min): 2 L/min   Intake/Output Summary (Last 24 hours) at 02/04/2019 0820 Last data filed at 02/04/2019 0100 Gross per 24 hour  Intake 1470 ml  Output 1050 ml  Net 420 ml   Filed Weights   02/01/19 1200 02/03/19 1032  Weight: 86.2 kg 77.8 kg    Exam: General exam: In Montes acute distress. Respiratory system: Good air movement and diffuse crackles Cardiovascular system: S1 & S2 heard, RRR. Montes JVD. Gastrointestinal system: Abdomen is nondistended, soft and nontender.  Central nervous system: Alert and  oriented. Montes focal neurological deficits. Extremities: Montes pedal edema. Skin: Montes rashes, lesions or ulcers Psychiatry: Judgement and insight appear normal. Mood & affect appropriate.    Data Reviewed:    Labs: Basic Metabolic Panel: Recent Labs  Lab 02/01/19 1230 02/03/19 0330  NA 130* 132*  K 4.4 4.6  CL 97* 100  CO2 25 25  GLUCOSE 140* 127*  BUN 20 22  CREATININE 1.08 1.03  CALCIUM 8.5* 8.7*   GFR Estimated Creatinine Clearance: 66 mL/min (by C-G formula based on SCr of 1.03 mg/dL). Liver Function Tests: Recent Labs  Lab 02/01/19 1230 02/03/19 0330  AST 59* 36  ALT 141* 109*  ALKPHOS 61 59  BILITOT 0.5 0.7  PROT 5.6* 6.2*  ALBUMIN 2.5* 2.7*   Montes results for input(s): LIPASE, AMYLASE in the last 168 hours. Montes results for input(s): AMMONIA in the last 168 hours. Coagulation profile Recent Labs  Lab 02/01/19 1230  INR 1.1   COVID-19 Labs  Recent Labs    02/01/19 1230 02/03/19 0330  DDIMER 1.35* 1.18*  FERRITIN  --  960*  CRP 8.1* 11.6*    Montes results found for: SARSCOV2NAA  CBC: Recent Labs  Lab 02/01/19 1230 02/03/19 0330  WBC 6.3 6.8  NEUTROABS 5.9 6.4  HGB 11.8* 11.5*  HCT 35.3* 34.9*  MCV 94.1 96.1  PLT 207 181   Cardiac Enzymes: Montes results for input(s): CKTOTAL, CKMB, CKMBINDEX, TROPONINI in the last 168 hours. BNP (last 3 results) Montes results for input(s): PROBNP in the last 8760 hours. CBG: Montes results for input(s): GLUCAP in the last 168 hours. D-Dimer: Recent Labs    02/01/19 1230 02/03/19 0330  DDIMER 1.35* 1.18*   Hgb A1c: Montes results for input(s): HGBA1C in the last 72 hours. Lipid Profile: Montes results for input(s): CHOL, HDL, LDLCALC, TRIG, CHOLHDL, LDLDIRECT in the last 72 hours. Thyroid function studies: Montes results for input(s): TSH, T4TOTAL, T3FREE, THYROIDAB in the last 72 hours.  Invalid input(s): FREET3 Anemia work up: Recent Labs    02/03/19 0330  FERRITIN 960*   Sepsis Labs: Recent Labs  Lab 02/01/19  1230 02/01/19 1430 02/03/19 0330  PROCALCITON 0.36  --   --   WBC 6.3  --  6.8  LATICACIDVEN 2.1* 1.3  --    Microbiology Recent Results (from the past 240 hour(s))  Blood Culture (routine x 2)     Status: None (Preliminary result)   Collection Time: 02/01/19 12:30 PM   Specimen: BLOOD  Result Value Ref Range Status   Specimen Description   Final    BLOOD LEFT ANTECUBITAL Performed at [facility], [address] [place], [place] [address]    Special Requests   Final    BOTTLES DRAWN AEROBIC AND ANAEROBIC Blood Culture adequate volume Performed at [facility], [address] [place], [place] [address]    Culture   Final    Montes GROWTH 3 DAYS Performed at [facility], [place] [address], [place], [place] [address]    Report Status PENDING  Incomplete  Urine culture     Status: None   Collection Time: 02/01/19 12:30 PM   Specimen: In/Out Cath Urine  Result Value Ref Range Status   Specimen Description   Final    IN/OUT CATH URINE Performed at [facility], [address] [place], [place] [address]    Special Requests   Final    NONE Performed at [facility], [address] [place], [place] [address]    Culture   Final    Montes GROWTH Performed at [facility], [place] [address], [place], [place] [address]    Report Status 02/02/2019 FINAL  Final  SARS Coronavirus 2 Ag (30 min TAT) - Nasal Swab (BD Veritor Kit)     Status: Abnormal   Collection Time: 02/01/19 12:30 PM   Specimen: Nasal Swab (BD Veritor Kit)  Result Value Ref Range Status   SARS Coronavirus 2 Ag POSITIVE (A) NEGATIVE Final    Comment: RESULT CALLED TO, READ BACK BY AND VERIFIED WITH: CALLED TO S.COBLE RN AT 2536 ON [id number] BY SROY (NOTE) SARS-CoV-2 antigen PRESENT. Positive results indicate the presence of viral antigens, but clinical correlation with patient history and other diagnostic information is necessary to determine patient infection status.   Positive results do not rule out bacterial infection or co-infection  with other viruses. False positive results are rare but can occur, and confirmatory RT-PCR testing may be appropriate in some circumstances. The expected result is Negative. Fact Sheet for Patients: PodPark.tn Fact Sheet for Providers: GiftContent.is  This test is not yet approved or cleared by the [place] FDA and  has been authorized for detection and/or diagnosis of SARS-CoV-2 by FDA under an Emergency Use Authorization (EUA).  This EUA will remain in effect (meaning this test can be used) for the duration of  the COVID -19 declaration under Section 564(b)(1) of the Act, 21 U.S.C. section 360bbb-3(b)(1), unless the authorization is terminated or revoked sooner. Performed at [facility], [address] [place], [place] [address]   Blood Culture (routine x 2)     Status: None (Preliminary result)   Collection Time: 02/01/19 12:31 PM   Specimen: BLOOD  Result Value Ref Range Status   Specimen Description   Final    BLOOD RIGHT ANTECUBITAL Performed at [facility], [address] [place], [place] [address]    Special Requests   Final    BOTTLES DRAWN AEROBIC AND ANAEROBIC Blood Culture adequate volume Performed at [facility], [address] [place], [place] [address]    Culture   Final    Montes GROWTH 3 DAYS Performed at [facility], [place] [address], [place], [place] [address]    Report Status PENDING  Incomplete  C difficile quick scan w PCR reflex     Status: None   Collection Time: 02/02/19  6:55 AM   Specimen: STOOL  Result Value Ref Range Status   C Diff antigen NEGATIVE NEGATIVE Final   C Diff toxin NEGATIVE NEGATIVE Final   C Diff interpretation Montes C. difficile detected.  Final    Comment: Performed at [facility], [place] [address], [place], [place] [address]  Gastrointestinal  Panel by PCR , Stool     Status: None   Collection Time: 02/02/19  6:55 AM   Specimen: Stool  Result Value Ref Range Status   Campylobacter species NOT DETECTED NOT DETECTED Final   Plesimonas shigelloides NOT DETECTED NOT DETECTED Final   Salmonella species NOT DETECTED NOT DETECTED Final   Yersinia enterocolitica NOT DETECTED NOT DETECTED Final   Vibrio species NOT DETECTED NOT DETECTED Final   Vibrio cholerae NOT DETECTED NOT DETECTED Final   Enteroaggregative E coli (EAEC) NOT DETECTED NOT DETECTED Final   Enteropathogenic E coli (EPEC) NOT DETECTED NOT DETECTED Final   Enterotoxigenic E coli (ETEC) NOT DETECTED NOT DETECTED Final   Shiga like toxin producing E coli (STEC) NOT DETECTED NOT DETECTED Final   Shigella/Enteroinvasive E coli (EIEC) NOT DETECTED NOT DETECTED Final   Cryptosporidium NOT DETECTED NOT DETECTED Final   Cyclospora cayetanensis NOT DETECTED NOT DETECTED Final   Entamoeba histolytica NOT DETECTED NOT DETECTED Final   Giardia lamblia NOT DETECTED NOT DETECTED Final   Adenovirus F40/41 NOT DETECTED NOT DETECTED Final   Astrovirus NOT DETECTED NOT DETECTED Final   Norovirus GI/GII NOT DETECTED NOT DETECTED Final   Rotavirus A NOT DETECTED NOT DETECTED Final   Sapovirus (I, II, IV, and V) NOT DETECTED NOT DETECTED Final    Comment: Performed at [facility], [address] [place], [place] [address]  MRSA PCR Screening     Status: None   Collection Time: 02/02/19  3:00 PM   Specimen: Nasopharyngeal  Result Value Ref Range Status   MRSA by PCR NEGATIVE NEGATIVE Final    Comment:        The GeneXpert MRSA Assay (FDA approved for NASAL specimens only), is one component of a comprehensive MRSA colonization surveillance program. It is not intended to diagnose MRSA infection nor to guide or monitor treatment for MRSA infections. Performed at [facility], [place] [address] [place], [place] [address]      Medications:   .  allopurinol  300 mg Oral Daily  . atenolol  25 mg Oral BID  . atorvastatin  10 mg Oral q1800  . docusate sodium  100 mg Oral Daily  . enoxaparin (LOVENOX) injection  40 mg Subcutaneous Q24H  . fluticasone  2 spray Each Nare Daily  . hydrOXYzine  25 mg Oral BID  . Ipratropium-Albuterol  1 puff Inhalation Q6H  . multivitamin with minerals  1 tablet Oral Daily  . pantoprazole  40 mg Oral BID  . sertraline  50 mg Oral Daily  . tamsulosin  0.4 mg Oral QPC supper  . vitamin C  500 mg Oral Daily  . zinc sulfate  220 mg Oral Daily   Continuous Infusions: . sodium chloride Stopped (02/01/19 2123)  . sodium chloride Stopped (02/01/19 1450)  . remdesivir 100 mg in NS 250 mL        LOS: 3 days   Charlynne Cousins  [facility]  02/04/2019, 8:20 AM

## 2019-02-05 LAB — COMPREHENSIVE METABOLIC PANEL
ALT: 84 U/L — ABNORMAL HIGH (ref 0–44)
AST: 42 U/L — ABNORMAL HIGH (ref 15–41)
Albumin: 2.5 g/dL — ABNORMAL LOW (ref 3.5–5.0)
Alkaline Phosphatase: 60 U/L (ref 38–126)
Anion gap: 11 (ref 5–15)
BUN: 29 mg/dL — ABNORMAL HIGH (ref 8–23)
CO2: 26 mmol/L (ref 22–32)
Calcium: 8.3 mg/dL — ABNORMAL LOW (ref 8.9–10.3)
Chloride: 96 mmol/L — ABNORMAL LOW (ref 98–111)
Creatinine, Ser: 1.19 mg/dL (ref 0.61–1.24)
GFR calc Af Amer: >60 mL/min (ref >60)
GFR calc non Af Amer: 59 mL/min — ABNORMAL LOW (ref >60)
Glucose, Bld: 108 mg/dL — ABNORMAL HIGH (ref 70–99)
Potassium: 4.5 mmol/L (ref 3.5–5.1)
Sodium: 133 mmol/L — ABNORMAL LOW (ref 135–145)
Total Bilirubin: 0.8 mg/dL (ref 0.3–1.2)
Total Protein: 6 g/dL — ABNORMAL LOW (ref 6.5–8.1)

## 2019-02-05 LAB — CBC WITH DIFFERENTIAL/PLATELET
Abs Immature Granulocytes: 0.03 10*3/uL (ref 0.00–0.07)
Basophils Absolute: 0 10*3/uL (ref 0.0–0.1)
Basophils Relative: 0 %
Eosinophils Absolute: 0 10*3/uL (ref 0.0–0.5)
Eosinophils Relative: 1 %
HCT: 33 % — ABNORMAL LOW (ref 39.0–52.0)
Hemoglobin: 10.8 g/dL — ABNORMAL LOW (ref 13.0–17.0)
Immature Granulocytes: 1 %
Lymphocytes Relative: 4 %
Lymphs Abs: 0.2 10*3/uL — ABNORMAL LOW (ref 0.7–4.0)
MCH: 31.5 pg (ref 26.0–34.0)
MCHC: 32.7 g/dL (ref 30.0–36.0)
MCV: 96.2 fL (ref 80.0–100.0)
Monocytes Absolute: 0.3 10*3/uL (ref 0.1–1.0)
Monocytes Relative: 5 %
Neutro Abs: 5.2 10*3/uL (ref 1.7–7.7)
Neutrophils Relative %: 89 %
Platelets: 187 10*3/uL (ref 150–400)
RBC: 3.43 MIL/uL — ABNORMAL LOW (ref 4.22–5.81)
RDW: 12.9 % (ref 11.5–15.5)
WBC: 5.7 10*3/uL (ref 4.0–10.5)
nRBC: 0 % (ref 0.0–0.2)

## 2019-02-05 LAB — FERRITIN: Ferritin: 1016 ng/mL — ABNORMAL HIGH (ref 24–336)

## 2019-02-05 LAB — D-DIMER, QUANTITATIVE: D-Dimer, Quant: 1.35 ug/mL-FEU — ABNORMAL HIGH (ref 0.00–0.50)

## 2019-02-05 LAB — C-REACTIVE PROTEIN: CRP: 14.6 mg/dL — ABNORMAL HIGH (ref <1.0)

## 2019-02-05 NOTE — Progress Notes (Signed)
Updated wife on POC

## 2019-02-05 NOTE — Progress Notes (Signed)
[facility] PROGRESS NOTE    Progress Note  Kirk Montes  CHE:[id number] DOB: 1945/08/13 DOA: 02/01/2019 PCP: No primary care provider on file.     Brief Narrative:   Kirk Montes is an 73 y.o. male past medical history of GERD, essential hypertension, prostate cancer and mantle cell lymphoma presents from [place] with progressive shortness of breath, he tested positive for COVID-19 on 12/26/2018 during this time he had mild symptoms and discharged home.  Was re-evaluated at [facility] on 01/08/2019 for headache and discharged home.  Returns to [facility] on 01/19/2019 for shortness of breath and was found to be hypoxic on supplemental oxygen.  Was admitted for 7 days he was treated with a 5-day course of IV remdesivir and Decadron for 10 days.  He was discharged home with supplemental oxygen with goals to wean off oxygen as an outpatient.  He returns on the day of admission on 02/12/2019 to the ER for worsening shortness of breath, he remained hypoxic and was transferred to [facility].  He has now nonproductive cough  Assessment/Plan:   Acute hypoxemic respiratory failure due to severe acute respiratory syndrome coronavirus 2 (SARS-CoV-2) disease (HCC)/ Pneumonia due to COVID-19 virus With a history of mantle cell lymphoma maintain his therapy. His procalcitonin was low yield. He has already completed a 10-day course of IV steroids, I restarted the remdesivir will give him an additional 5 days. He is currently requiring 1 to 2 L to keep saturations greater than 97% I think we can wean him to room air.  We will ambulate and check saturations with ambulation.   Nonbloody diarrhea: C. difficile PCR was negative, GI PCR panel is negative. Related to COVID-19  Essential hypertension Continue atenolol. Blood pressure is fairly controlled.  Non Hodgkin's lymphoma ([facility]) mantle cell lymphoma: Noted will need to follow-up with PCP as an  outpatient.   DVT prophylaxis: lovenox Family Communication:none Disposition Plan/Barrier to D/C: unable to determine Code Status:     Code Status Orders  (From admission, onward)         Start     Ordered   02/02/19 0004  Full code  Continuous     02/02/19 0005        Code Status History    This patient has a current code status but no historical code status.   Advance Care Planning Activity    Advance Directive Documentation     Most Recent Value  Type of Advance Directive  Healthcare Power of Attorney  Pre-existing out of facility DNR order (yellow form or pink MOST form)  -  "MOST" Form in Place?  -        IV Access:    Peripheral IV   Procedures and diagnostic studies:   No results found.   Medical Consultants:    None.  Anti-Infectives:   IV remdesivir. Subjective:    [name] is in a happy joyful mood this morning, he relates he enjoyed his breakfast.  He relates his breathing is significantly improved compared to yesterday. Objective:    Vitals:   02/04/19 2317 02/05/19 0340 02/05/19 0706 02/05/19 0734  BP: 132/62 115/63 104/84   Pulse: 77 81 73   Resp: (!) 24 19 (!) 22   Temp: 97.9 F (36.6 C) 98.2 F (36.8 C) 99.1 F (37.3 C)   TempSrc: Oral Oral Oral   SpO2: 91% 95% 95% 97%  Weight:      Height:  SpO2: 97 % O2 Flow Rate (L/min): 1 L/min   Intake/Output Summary (Last 24 hours) at 02/05/2019 0746 Last data filed at 02/05/2019 0340 Gross per 24 hour  Intake 1480 ml  Output 1650 ml  Net -170 ml   Filed Weights   02/01/19 1200 02/03/19 1032  Weight: 86.2 kg 77.8 kg    Exam: General exam: In no acute distress. Respiratory system: Good air movement and use crackles mainly on the left. Cardiovascular system: S1 & S2 heard, RRR. No JVD. Gastrointestinal system: Abdomen is nondistended, soft and nontender.  Central nervous system: Alert and oriented. No focal neurological deficits. Extremities: No pedal edema.  Skin: No rashes, lesions or ulcers Psychiatry: Judgement and insight appear normal. Mood & affect appropriate.    Data Reviewed:    Labs: Basic Metabolic Panel: Recent Labs  Lab 02/01/19 1230 02/03/19 0330 02/04/19 0615 02/05/19 0112  NA 130* 132* 130* 133*  K 4.4 4.6 4.1 4.5  CL 97* 100 96* 96*  CO2 '25 25 25 26  ' GLUCOSE 140* 127* 82 108*  BUN '20 22 23 ' 29*  CREATININE 1.08 1.03 1.06 1.19  CALCIUM 8.5* 8.7* 8.5* 8.3*   GFR Estimated Creatinine Clearance: 57.1 mL/min (by C-G formula based on SCr of 1.19 mg/dL). Liver Function Tests: Recent Labs  Lab 02/01/19 1230 02/03/19 0330 02/04/19 0615 02/05/19 0112  AST 59* 36 27 42*  ALT 141* 109* 78* 84*  ALKPHOS 61 59 57 60  BILITOT 0.5 0.7 0.8 0.8  PROT 5.6* 6.2* 5.9* 6.0*  ALBUMIN 2.5* 2.7* 2.5* 2.5*   No results for input(s): LIPASE, AMYLASE in the last 168 hours. No results for input(s): AMMONIA in the last 168 hours. Coagulation profile Recent Labs  Lab 02/01/19 1230  INR 1.1   COVID-19 Labs  Recent Labs    02/03/19 0330 02/04/19 0615 02/05/19 0112  DDIMER 1.18* 0.96* 1.35*  FERRITIN 960* 1,193* 1,016*  CRP 11.6* 9.9* 14.6*    No results found for: SARSCOV2NAA  CBC: Recent Labs  Lab 02/01/19 1230 02/03/19 0330 02/04/19 0615 02/05/19 0112  WBC 6.3 6.8 5.8 5.7  NEUTROABS 5.9 6.4 5.4 5.2  HGB 11.8* 11.5* 10.8* 10.8*  HCT 35.3* 34.9* 32.9* 33.0*  MCV 94.1 96.1 94.3 96.2  PLT 207 181 191 187   Cardiac Enzymes: No results for input(s): CKTOTAL, CKMB, CKMBINDEX, TROPONINI in the last 168 hours. BNP (last 3 results) No results for input(s): PROBNP in the last 8760 hours. CBG: No results for input(s): GLUCAP in the last 168 hours. D-Dimer: Recent Labs    02/04/19 0615 02/05/19 0112  DDIMER 0.96* 1.35*   Hgb A1c: No results for input(s): HGBA1C in the last 72 hours. Lipid Profile: No results for input(s): CHOL, HDL, LDLCALC, TRIG, CHOLHDL, LDLDIRECT in the last 72 hours. Thyroid function  studies: No results for input(s): TSH, T4TOTAL, T3FREE, THYROIDAB in the last 72 hours.  Invalid input(s): FREET3 Anemia work up: Recent Labs    02/04/19 0615 02/05/19 0112  FERRITIN 1,193* 1,016*   Sepsis Labs: Recent Labs  Lab 02/01/19 1230 02/01/19 1430 02/03/19 0330 02/04/19 0615 02/05/19 0112  PROCALCITON 0.36  --   --   --   --   WBC 6.3  --  6.8 5.8 5.7  LATICACIDVEN 2.1* 1.3  --   --   --    Microbiology Recent Results (from the past 240 hour(s))  Blood Culture (routine x 2)     Status: None (Preliminary result)   Collection Time: 02/01/19 12:30  PM   Specimen: BLOOD  Result Value Ref Range Status   Specimen Description   Final    BLOOD LEFT ANTECUBITAL Performed at [facility], [address] [place], [place] [address]    Special Requests   Final    BOTTLES DRAWN AEROBIC AND ANAEROBIC Blood Culture adequate volume Performed at [facility], [address] [place], [place] [address]    Culture   Final    NO GROWTH 3 DAYS Performed at [facility], [place] [address] [place], [place] [address]    Report Status PENDING  Incomplete  Urine culture     Status: None   Collection Time: 02/01/19 12:30 PM   Specimen: In/Out Cath Urine  Result Value Ref Range Status   Specimen Description   Final    IN/OUT CATH URINE Performed at [facility], [address] [place], [place] [address]    Special Requests   Final    NONE Performed at [facility], [address] [place], [place] [address]    Culture   Final    NO GROWTH Performed at [facility], [place] [address] [place], [place] [address]    Report Status 02/02/2019 FINAL  Final  SARS Coronavirus 2 Ag (30 min TAT) - Nasal Swab (BD Veritor Kit)     Status: Abnormal   Collection Time: 02/01/19 12:30 PM   Specimen: Nasal Swab (BD Veritor Kit)  Result Value Ref Range Status   SARS Coronavirus 2 Ag POSITIVE (A) NEGATIVE Final    Comment: RESULT CALLED TO,  READ BACK BY AND VERIFIED WITH: CALLED TO S.COBLE RN AT [id number] ON [id number] BY SROY (NOTE) SARS-CoV-2 antigen PRESENT. Positive results indicate the presence of viral antigens, but clinical correlation with patient history and other diagnostic information is necessary to determine patient infection status.  Positive results do not rule out bacterial infection or co-infection  with other viruses. False positive results are rare but can occur, and confirmatory RT-PCR testing may be appropriate in some circumstances. The expected result is Negative. Fact Sheet for Patients: PodPark.tn Fact Sheet for Providers: GiftContent.is  This test is not yet approved or cleared by the [place] FDA and  has been authorized for detection and/or diagnosis of SARS-CoV-2 by FDA under an Emergency Use Authorization (EUA).  This EUA will remain in effect (meaning this test can be used) for the duration of  the COVID -19 declaration under Section 564(b)(1) of the Act, 21 U.S.C. section 360bbb-3(b)(1), unless the authorization is terminated or revoked sooner. Performed at [facility], [address] [place], [place] [address]   Blood Culture (routine x 2)     Status: None (Preliminary result)   Collection Time: 02/01/19 12:31 PM   Specimen: BLOOD  Result Value Ref Range Status   Specimen Description   Final    BLOOD RIGHT ANTECUBITAL Performed at [facility], [address] [place], [place] [address]    Special Requests   Final    BOTTLES DRAWN AEROBIC AND ANAEROBIC Blood Culture adequate volume Performed at [facility], [address] [place], [place] [address]    Culture   Final    NO GROWTH 3 DAYS Performed at [facility], [place] [address], [place], [place] [address]    Report Status PENDING  Incomplete  C difficile quick scan w PCR reflex  Status: None   Collection Time: 02/02/19  6:55 AM    Specimen: STOOL  Result Value Ref Range Status   C Diff antigen NEGATIVE NEGATIVE Final   C Diff toxin NEGATIVE NEGATIVE Final   C Diff interpretation No C. difficile detected.  Final    Comment: Performed at [facility], [place] [address] [place], [place] [address]  Gastrointestinal Panel by PCR , Stool     Status: None   Collection Time: 02/02/19  6:55 AM   Specimen: Stool  Result Value Ref Range Status   Campylobacter species NOT DETECTED NOT DETECTED Final   Plesimonas shigelloides NOT DETECTED NOT DETECTED Final   Salmonella species NOT DETECTED NOT DETECTED Final   Yersinia enterocolitica NOT DETECTED NOT DETECTED Final   Vibrio species NOT DETECTED NOT DETECTED Final   Vibrio cholerae NOT DETECTED NOT DETECTED Final   Enteroaggregative E coli (EAEC) NOT DETECTED NOT DETECTED Final   Enteropathogenic E coli (EPEC) NOT DETECTED NOT DETECTED Final   Enterotoxigenic E coli (ETEC) NOT DETECTED NOT DETECTED Final   Shiga like toxin producing E coli (STEC) NOT DETECTED NOT DETECTED Final   Shigella/Enteroinvasive E coli (EIEC) NOT DETECTED NOT DETECTED Final   Cryptosporidium NOT DETECTED NOT DETECTED Final   Cyclospora cayetanensis NOT DETECTED NOT DETECTED Final   Entamoeba histolytica NOT DETECTED NOT DETECTED Final   Giardia lamblia NOT DETECTED NOT DETECTED Final   Adenovirus F40/41 NOT DETECTED NOT DETECTED Final   Astrovirus NOT DETECTED NOT DETECTED Final   Norovirus GI/GII NOT DETECTED NOT DETECTED Final   Rotavirus A NOT DETECTED NOT DETECTED Final   Sapovirus (I, II, IV, and V) NOT DETECTED NOT DETECTED Final    Comment: Performed at [facility], [address] [place], [place] [address]  MRSA PCR Screening     Status: None   Collection Time: 02/02/19  3:00 PM   Specimen: Nasopharyngeal  Result Value Ref Range Status   MRSA by PCR NEGATIVE NEGATIVE Final    Comment:        The GeneXpert MRSA Assay (FDA approved for NASAL specimens only),  is one component of a comprehensive MRSA colonization surveillance program. It is not intended to diagnose MRSA infection nor to guide or monitor treatment for MRSA infections. Performed at [facility], [place] [address], [place], [place] [address]      Medications:   . allopurinol  300 mg Oral Daily  . atenolol  25 mg Oral BID  . atorvastatin  10 mg Oral q1800  . docusate sodium  100 mg Oral Daily  . enoxaparin (LOVENOX) injection  40 mg Subcutaneous Q24H  . fluticasone  2 spray Each Nare Daily  . hydrOXYzine  25 mg Oral BID  . Ipratropium-Albuterol  1 puff Inhalation Q6H  . multivitamin with minerals  1 tablet Oral Daily  . pantoprazole  40 mg Oral BID  . sertraline  50 mg Oral Daily  . tamsulosin  0.4 mg Oral QPC supper  . vitamin C  500 mg Oral Daily  . zinc sulfate  220 mg Oral Daily   Continuous Infusions: . sodium chloride Stopped (02/01/19 2123)  . sodium chloride Stopped (02/01/19 1450)  . remdesivir 100 mg in NS 100 mL Stopped (02/04/19 1040)      LOS: 4 days   Charlynne Cousins  [facility]  02/05/2019, 7:46 AM

## 2019-02-06 LAB — CBC WITH DIFFERENTIAL/PLATELET
Abs Immature Granulocytes: 0.05 10*3/uL (ref 0.00–0.07)
Basophils Absolute: 0 10*3/uL (ref 0.0–0.1)
Basophils Relative: 0 %
Eosinophils Absolute: 0 10*3/uL (ref 0.0–0.5)
Eosinophils Relative: 1 %
HCT: 31.7 % — ABNORMAL LOW (ref 39.0–52.0)
Hemoglobin: 10.2 g/dL — ABNORMAL LOW (ref 13.0–17.0)
Immature Granulocytes: 1 %
Lymphocytes Relative: 5 %
Lymphs Abs: 0.3 10*3/uL — ABNORMAL LOW (ref 0.7–4.0)
MCH: 30.8 pg (ref 26.0–34.0)
MCHC: 32.2 g/dL (ref 30.0–36.0)
MCV: 95.8 fL (ref 80.0–100.0)
Monocytes Absolute: 0.3 10*3/uL (ref 0.1–1.0)
Monocytes Relative: 5 %
Neutro Abs: 4.5 10*3/uL (ref 1.7–7.7)
Neutrophils Relative %: 88 %
Platelets: 186 10*3/uL (ref 150–400)
RBC: 3.31 MIL/uL — ABNORMAL LOW (ref 4.22–5.81)
RDW: 12.9 % (ref 11.5–15.5)
WBC: 5 10*3/uL (ref 4.0–10.5)
nRBC: 0 % (ref 0.0–0.2)

## 2019-02-06 LAB — COMPREHENSIVE METABOLIC PANEL
ALT: 68 U/L — ABNORMAL HIGH (ref 0–44)
AST: 33 U/L (ref 15–41)
Albumin: 2.4 g/dL — ABNORMAL LOW (ref 3.5–5.0)
Alkaline Phosphatase: 61 U/L (ref 38–126)
Anion gap: 10 (ref 5–15)
BUN: 29 mg/dL — ABNORMAL HIGH (ref 8–23)
CO2: 26 mmol/L (ref 22–32)
Calcium: 8.7 mg/dL — ABNORMAL LOW (ref 8.9–10.3)
Chloride: 97 mmol/L — ABNORMAL LOW (ref 98–111)
Creatinine, Ser: 1.12 mg/dL (ref 0.61–1.24)
GFR calc Af Amer: >60 mL/min (ref >60)
GFR calc non Af Amer: >60 mL/min (ref >60)
Glucose, Bld: 127 mg/dL — ABNORMAL HIGH (ref 70–99)
Potassium: 4.5 mmol/L (ref 3.5–5.1)
Sodium: 133 mmol/L — ABNORMAL LOW (ref 135–145)
Total Bilirubin: 0.4 mg/dL (ref 0.3–1.2)
Total Protein: 5.8 g/dL — ABNORMAL LOW (ref 6.5–8.1)

## 2019-02-06 LAB — FERRITIN: Ferritin: 1179 ng/mL — ABNORMAL HIGH (ref 24–336)

## 2019-02-06 LAB — CULTURE, BLOOD (ROUTINE X 2)
Culture: NO GROWTH
Culture: NO GROWTH
Special Requests: ADEQUATE
Special Requests: ADEQUATE

## 2019-02-06 LAB — C-REACTIVE PROTEIN: CRP: 15.8 mg/dL — ABNORMAL HIGH (ref <1.0)

## 2019-02-06 LAB — D-DIMER, QUANTITATIVE: D-Dimer, Quant: 0.93 ug/mL-FEU — ABNORMAL HIGH (ref 0.00–0.50)

## 2019-02-06 MED ORDER — SODIUM CHLORIDE 0.9 % IV SOLN
100.0000 mg | Freq: Every day | INTRAVENOUS | Status: AC
  Administered 2019-02-07: 100 mg via INTRAVENOUS
  Filled 2019-02-06: qty 20

## 2019-02-06 NOTE — Progress Notes (Signed)
[facility] PROGRESS NOTE    Progress Note  Kirk Montes  KDX:[id number] DOB: 01-Apr-1945 DOA: 02/01/2019 PCP: No primary care provider on file.     Brief Narrative:   Kirk Montes is an 73 y.o. male past medical history of GERD, essential hypertension, prostate cancer and mantle cell lymphoma presents from [place] with progressive shortness of breath, he tested positive for COVID-19 on 12/26/2018 during this time he had mild symptoms and discharged home.  Was re-evaluated at [facility] on 01/08/2019 for headache and discharged home.  Returns to [facility] on 01/19/2019 for shortness of breath and was found to be hypoxic on supplemental oxygen.  Was admitted for 7 days he was treated with a 5-day course of IV remdesivir and Decadron for 10 days.  He was discharged home with supplemental oxygen with goals to wean off oxygen as an outpatient.  He returns on the day of admission on 02/12/2019 to the ER for worsening shortness of breath, he remained hypoxic and was transferred to [facility].  He has now nonproductive cough  Assessment/Plan:   Acute hypoxemic respiratory failure due to severe acute respiratory syndrome coronavirus 2 (SARS-CoV-2) disease (HCC)/ Pneumonia due to COVID-19 virus With a history of mantle cell lymphoma maintain his therapy. His procalcitonin was low yield. Has already completed 10-day course of IV steroids. Continue IV remdesivir. He is still requiring 1 L of oxygen to keep saturations greater than 94%, he desats overnight question obstructive sleep apnea. We will ambulate and check saturations with ambulation.  Nonbloody diarrhea: C. difficile PCR was negative, GI PCR panel is negative. Related to COVID-19  Essential hypertension Continue atenolol. Blood pressure is fairly controlled.  Non Hodgkin's lymphoma ([place]) mantle cell lymphoma: Noted will need to follow-up with PCP as an outpatient.   DVT  prophylaxis: lovenox Family Communication:none Disposition Plan/Barrier to D/C: unable to determine Code Status:     Code Status Orders  (From admission, onward)         Start     Ordered   02/02/19 0004  Full code  Continuous     02/02/19 0005        Code Status History    This patient has a current code status but no historical code status.   Advance Care Planning Activity    Advance Directive Documentation     Most Recent Value  Type of Advance Directive  Healthcare Power of Attorney  Pre-existing out of facility DNR order (yellow form or pink MOST form)  -  "MOST" Form in Place?  -        IV Access:    Peripheral IV   Procedures and diagnostic studies:   No results found.   Medical Consultants:    None.  Anti-Infectives:   IV remdesivir. Subjective:    Kirk Montes continues to be in a good mood, he relates he continues to feel better every day. Objective:    Vitals:   02/05/19 2022 02/06/19 0000 02/06/19 0403 02/06/19 0415  BP: 140/71 117/65 125/64   Pulse: 91 82 73 72  Resp: 19 (!) 23 (!) 25 20  Temp: 98.9 F (37.2 C)  98.3 F (36.8 C)   TempSrc: Oral  Oral   SpO2: 93% 97% 96% 98%  Weight:      Height:       SpO2: 98 % O2 Flow Rate (L/min): 1 L/min   Intake/Output Summary (Last 24 hours) at 02/06/2019 9767 Last data filed  at 02/05/2019 1839 Gross per 24 hour  Intake 850 ml  Output 400 ml  Net 450 ml   Filed Weights   02/01/19 1200 02/03/19 1032  Weight: 86.2 kg 77.8 kg    Exam: General exam: In no acute distress. Respiratory system: Good air movement and crackles bilaterally. Cardiovascular system: S1 & S2 heard, RRR. No JVD. Gastrointestinal system: Abdomen is nondistended, soft and nontender.  Central nervous system: Alert and oriented. No focal neurological deficits. Extremities: No pedal edema. Skin: No rashes, lesions or ulcers Psychiatry: Judgement and insight appear normal. Mood & affect appropriate.    Data  Reviewed:    Labs: Basic Metabolic Panel: Recent Labs  Lab 02/01/19 1230 02/03/19 0330 02/04/19 0615 02/05/19 0112 02/06/19 0007  NA 130* 132* 130* 133* 133*  K 4.4 4.6 4.1 4.5 4.5  CL 97* 100 96* 96* 97*  CO2 _0 GLUCOSE 140* 127* 82 108* 127*  BUN _1 29* 29*  CREATININE 1.08 1.03 1.06 1.19 1.12  CALCIUM 8.5* 8.7* 8.5* 8.3* 8.7*   GFR Estimated Creatinine Clearance: 60.7 mL/min (by C-G formula based on SCr of 1.12 mg/dL). Liver Function Tests: Recent Labs  Lab 02/01/19 1230 02/03/19 0330 02/04/19 0615 02/05/19 0112 02/06/19 0007  AST 59* 36 27 42* 33  ALT 141* 109* 78* 84* 68*  ALKPHOS 61 59 57 60 61  BILITOT 0.5 0.7 0.8 0.8 0.4  PROT 5.6* 6.2* 5.9* 6.0* 5.8*  ALBUMIN 2.5* 2.7* 2.5* 2.5* 2.4*   No results for input(s): LIPASE, AMYLASE in the last 168 hours. No results for input(s): AMMONIA in the last 168 hours. Coagulation profile Recent Labs  Lab 02/01/19 1230  INR 1.1   COVID-19 Labs  Recent Labs    02/04/19 0615 02/05/19 0112 02/06/19 0007  DDIMER 0.96* 1.35* 0.93*  FERRITIN 1,193* 1,016* 1,179*  CRP 9.9* 14.6* 15.8*    No results found for: SARSCOV2NAA  CBC: Recent Labs  Lab 02/01/19 1230 02/03/19 0330 02/04/19 0615 02/05/19 0112 02/06/19 0007  WBC 6.3 6.8 5.8 5.7 5.0  NEUTROABS 5.9 6.4 5.4 5.2 4.5  HGB 11.8* 11.5* 10.8* 10.8* 10.2*  HCT 35.3* 34.9* 32.9* 33.0* 31.7*  MCV 94.1 96.1 94.3 96.2 95.8  PLT 207 181 191 187 186   Cardiac Enzymes: No results for input(s): CKTOTAL, CKMB, CKMBINDEX, TROPONINI in the last 168 hours. BNP (last 3 results) No results for input(s): PROBNP in the last 8760 hours. CBG: No results for input(s): GLUCAP in the last 168 hours. D-Dimer: Recent Labs    02/05/19 0112 02/06/19 0007  DDIMER 1.35* 0.93*   Hgb A1c: No results for input(s): HGBA1C in the last 72 hours. Lipid Profile: No results for input(s): CHOL, HDL, LDLCALC, TRIG, CHOLHDL, LDLDIRECT in the last 72 hours.  Thyroid function studies: No results for input(s): TSH, T4TOTAL, T3FREE, THYROIDAB in the last 72 hours.  Invalid input(s): FREET3 Anemia work up: Recent Labs    02/05/19 0112 02/06/19 0007  FERRITIN 1,016* 1,179*   Sepsis Labs: Recent Labs  Lab 02/01/19 1230 02/01/19 1430 02/03/19 0330 02/04/19 0615 02/05/19 0112 02/06/19 0007  PROCALCITON 0.36  --   --   --   --   --   WBC 6.3  --  6.8 5.8 5.7 5.0  LATICACIDVEN 2.1* 1.3  --   --   --   --    Microbiology Recent Results (from the past 240 hour(s))  Blood Culture (routine x 2)     Status:  None (Preliminary result)   Collection Time: 02/01/19 12:30 PM   Specimen: BLOOD  Result Value Ref Range Status   Specimen Description   Final    BLOOD LEFT ANTECUBITAL Performed at [facility], [address] [place], [place] [address]    Special Requests   Final    BOTTLES DRAWN AEROBIC AND ANAEROBIC Blood Culture adequate volume Performed at [facility], [address] [place], [place] [address]    Culture   Final    NO GROWTH 4 DAYS Performed at [facility], [place] [address], [place], [place] [address]    Report Status PENDING  Incomplete  Urine culture     Status: None   Collection Time: 02/01/19 12:30 PM   Specimen: In/Out Cath Urine  Result Value Ref Range Status   Specimen Description   Final    IN/OUT CATH URINE Performed at [facility], [address] [place], [place] [address]    Special Requests   Final    NONE Performed at [facility], [address] [place], [place] [address]    Culture   Final    NO GROWTH Performed at [facility], [place] [address], [place], [place] [address]    Report Status 02/02/2019 FINAL  Final  SARS Coronavirus 2 Ag (30 min TAT) - Nasal Swab (BD Veritor Kit)     Status: Abnormal   Collection Time: 02/01/19 12:30 PM   Specimen: Nasal Swab (BD Veritor Kit)  Result Value Ref Range Status   SARS Coronavirus 2 Ag POSITIVE  (A) NEGATIVE Final    Comment: RESULT CALLED TO, READ BACK BY AND VERIFIED WITH: CALLED TO S.COBLE RN AT 2694 ON [id number] BY SROY (NOTE) SARS-CoV-2 antigen PRESENT. Positive results indicate the presence of viral antigens, but clinical correlation with patient history and other diagnostic information is necessary to determine patient infection status.  Positive results do not rule out bacterial infection or co-infection  with other viruses. False positive results are rare but can occur, and confirmatory RT-PCR testing may be appropriate in some circumstances. The expected result is Negative. Fact Sheet for Patients: PodPark.tn Fact Sheet for Providers: GiftContent.is  This test is not yet approved or cleared by the [place] FDA and  has been authorized for detection and/or diagnosis of SARS-CoV-2 by FDA under an Emergency Use Authorization (EUA).  This EUA will remain in effect (meaning this test can be used) for the duration of  the COVID -19 declaration under Section 564(b)(1) of the Act, 21 U.S.C. section 360bbb-3(b)(1), unless the authorization is terminated or revoked sooner. Performed at [facility], [address] [place], [place] [address]   Blood Culture (routine x 2)     Status: None (Preliminary result)   Collection Time: 02/01/19 12:31 PM   Specimen: BLOOD  Result Value Ref Range Status   Specimen Description   Final    BLOOD RIGHT ANTECUBITAL Performed at [facility], [address] [place], [place] [address]    Special Requests   Final    BOTTLES DRAWN AEROBIC AND ANAEROBIC Blood Culture adequate volume Performed at [facility], [address] [place], [place] [address]    Culture   Final    NO GROWTH 4 DAYS Performed at [facility], [place] [address], [place], [place] [address]    Report Status PENDING  Incomplete  C  difficile quick scan w PCR reflex      Status: None   Collection Time: 02/02/19  6:55 AM   Specimen: STOOL  Result Value Ref Range Status   C Diff antigen NEGATIVE NEGATIVE Final   C Diff toxin NEGATIVE NEGATIVE Final   C Diff interpretation No C. difficile detected.  Final    Comment: Performed at [facility], [place] [address] [place], [place] [address]  Gastrointestinal Panel by PCR , Stool     Status: None   Collection Time: 02/02/19  6:55 AM   Specimen: Stool  Result Value Ref Range Status   Campylobacter species NOT DETECTED NOT DETECTED Final   Plesimonas shigelloides NOT DETECTED NOT DETECTED Final   Salmonella species NOT DETECTED NOT DETECTED Final   Yersinia enterocolitica NOT DETECTED NOT DETECTED Final   Vibrio species NOT DETECTED NOT DETECTED Final   Vibrio cholerae NOT DETECTED NOT DETECTED Final   Enteroaggregative E coli (EAEC) NOT DETECTED NOT DETECTED Final   Enteropathogenic E coli (EPEC) NOT DETECTED NOT DETECTED Final   Enterotoxigenic E coli (ETEC) NOT DETECTED NOT DETECTED Final   Shiga like toxin producing E coli (STEC) NOT DETECTED NOT DETECTED Final   Shigella/Enteroinvasive E coli (EIEC) NOT DETECTED NOT DETECTED Final   Cryptosporidium NOT DETECTED NOT DETECTED Final   Cyclospora cayetanensis NOT DETECTED NOT DETECTED Final   Entamoeba histolytica NOT DETECTED NOT DETECTED Final   Giardia lamblia NOT DETECTED NOT DETECTED Final   Adenovirus F40/41 NOT DETECTED NOT DETECTED Final   Astrovirus NOT DETECTED NOT DETECTED Final   Norovirus GI/GII NOT DETECTED NOT DETECTED Final   Rotavirus A NOT DETECTED NOT DETECTED Final   Sapovirus (I, II, IV, and V) NOT DETECTED NOT DETECTED Final    Comment: Performed at [facility], [address] [place], [place] [address]  MRSA PCR Screening     Status: None   Collection Time: 02/02/19  3:00 PM   Specimen: Nasopharyngeal  Result Value Ref Range Status   MRSA by PCR NEGATIVE NEGATIVE Final    Comment:        The GeneXpert  MRSA Assay (FDA approved for NASAL specimens only), is one component of a comprehensive MRSA colonization surveillance program. It is not intended to diagnose MRSA infection nor to guide or monitor treatment for MRSA infections. Performed at [facility], [place] [address], [place], [place] [address]      Medications:   . allopurinol  300 mg Oral Daily  . atenolol  25 mg Oral BID  . atorvastatin  10 mg Oral q1800  . docusate sodium  100 mg Oral Daily  . enoxaparin (LOVENOX) injection  40 mg Subcutaneous Q24H  . fluticasone  2 spray Each Nare Daily  . hydrOXYzine  25 mg Oral BID  . Ipratropium-Albuterol  1 puff Inhalation Q6H  . multivitamin with minerals  1 tablet Oral Daily  . pantoprazole  40 mg Oral BID  . sertraline  50 mg Oral Daily  . tamsulosin  0.4 mg Oral QPC supper  . vitamin C  500 mg Oral Daily  . zinc sulfate  220 mg Oral Daily   Continuous Infusions: . sodium chloride Stopped (02/01/19 2123)  . sodium chloride Stopped (02/01/19 1450)  . remdesivir 100 mg in NS 100 mL Stopped (02/05/19 0947)      LOS: 5 days   Charlynne Cousins  [facility]  02/06/2019, 7:55 AM

## 2019-02-07 LAB — CBC WITH DIFFERENTIAL/PLATELET
Abs Immature Granulocytes: 0.03 10*3/uL (ref 0.00–0.07)
Basophils Absolute: 0 10*3/uL (ref 0.0–0.1)
Basophils Relative: 0 %
Eosinophils Absolute: 0.1 10*3/uL (ref 0.0–0.5)
Eosinophils Relative: 2 %
HCT: 30.8 % — ABNORMAL LOW (ref 39.0–52.0)
Hemoglobin: 10.1 g/dL — ABNORMAL LOW (ref 13.0–17.0)
Immature Granulocytes: 1 %
Lymphocytes Relative: 6 %
Lymphs Abs: 0.3 10*3/uL — ABNORMAL LOW (ref 0.7–4.0)
MCH: 31 pg (ref 26.0–34.0)
MCHC: 32.8 g/dL (ref 30.0–36.0)
MCV: 94.5 fL (ref 80.0–100.0)
Monocytes Absolute: 0.4 10*3/uL (ref 0.1–1.0)
Monocytes Relative: 7 %
Neutro Abs: 4.2 10*3/uL (ref 1.7–7.7)
Neutrophils Relative %: 84 %
Platelets: 198 10*3/uL (ref 150–400)
RBC: 3.26 MIL/uL — ABNORMAL LOW (ref 4.22–5.81)
RDW: 12.9 % (ref 11.5–15.5)
WBC: 5 10*3/uL (ref 4.0–10.5)
nRBC: 0 % (ref 0.0–0.2)

## 2019-02-07 LAB — COMPREHENSIVE METABOLIC PANEL
ALT: 64 U/L — ABNORMAL HIGH (ref 0–44)
AST: 30 U/L (ref 15–41)
Albumin: 2.5 g/dL — ABNORMAL LOW (ref 3.5–5.0)
Alkaline Phosphatase: 63 U/L (ref 38–126)
Anion gap: 11 (ref 5–15)
BUN: 23 mg/dL (ref 8–23)
CO2: 25 mmol/L (ref 22–32)
Calcium: 8.6 mg/dL — ABNORMAL LOW (ref 8.9–10.3)
Chloride: 99 mmol/L (ref 98–111)
Creatinine, Ser: 1.03 mg/dL (ref 0.61–1.24)
GFR calc Af Amer: >60 mL/min (ref >60)
GFR calc non Af Amer: >60 mL/min (ref >60)
Glucose, Bld: 109 mg/dL — ABNORMAL HIGH (ref 70–99)
Potassium: 4.5 mmol/L (ref 3.5–5.1)
Sodium: 135 mmol/L (ref 135–145)
Total Bilirubin: 0.7 mg/dL (ref 0.3–1.2)
Total Protein: 5.7 g/dL — ABNORMAL LOW (ref 6.5–8.1)

## 2019-02-07 LAB — C-REACTIVE PROTEIN: CRP: 15.4 mg/dL — ABNORMAL HIGH (ref <1.0)

## 2019-02-07 LAB — FERRITIN: Ferritin: 1057 ng/mL — ABNORMAL HIGH (ref 24–336)

## 2019-02-07 LAB — D-DIMER, QUANTITATIVE: D-Dimer, Quant: 0.62 ug/mL-FEU — ABNORMAL HIGH (ref 0.00–0.50)

## 2019-02-07 NOTE — Plan of Care (Signed)
Alert and oriented 4x, transfer from PCU this pm, waiting for discharge tomorrow. Room air with O2 saturation in the mid 90's, no complaint of SOB or pain. Eating dinner, meds given. Problem: Education: Goal: Knowledge of General Education information will improve Description: Including pain rating scale, medication(s)/side effects and non-pharmacologic comfort measures Outcome: Progressing   Problem: Health Behavior/Discharge Planning: Goal: Ability to manage health-related needs will improve Outcome: Progressing   Problem: Clinical Measurements: Goal: Ability to maintain clinical measurements within normal limits will improve Outcome: Progressing Goal: Will remain free from infection Outcome: Progressing Goal: Diagnostic test results will improve Outcome: Progressing Goal: Respiratory complications will improve Outcome: Progressing Goal: Cardiovascular complication will be avoided Outcome: Progressing   Problem: Activity: Goal: Risk for activity intolerance will decrease Outcome: Progressing   Problem: Nutrition: Goal: Adequate nutrition will be maintained Outcome: Progressing   Problem: Coping: Goal: Level of anxiety will decrease Outcome: Progressing   Problem: Elimination: Goal: Will not experience complications related to bowel motility Outcome: Progressing Goal: Will not experience complications related to urinary retention Outcome: Progressing   Problem: Pain Managment: Goal: General experience of comfort will improve Outcome: Progressing   Problem: Safety: Goal: Ability to remain free from injury will improve Outcome: Progressing   Problem: Skin Integrity: Goal: Risk for impaired skin integrity will decrease Outcome: Progressing   Problem: Education: Goal: Knowledge of risk factors and measures for prevention of condition will improve Outcome: Progressing   Problem: Coping: Goal: Psychosocial and spiritual needs will be supported Outcome:  Progressing   Problem: Respiratory: Goal: Will maintain a patent airway Outcome: Progressing Goal: Complications related to the disease process, condition or treatment will be avoided or minimized Outcome: Progressing

## 2019-02-07 NOTE — Progress Notes (Signed)
SATURATION QUALIFICATIONS: (This note is used to comply with regulatory documentation for home oxygen)  Patient Saturations on Room Air at Rest = 97%  Patient Saturations on Room Air while Ambulating = 80%  Patient Saturations on 2 Liters of oxygen while Ambulating = 93%  Please briefly explain why patient needs home oxygen: Pt cannot maintain saturations when moving around. He requires 2L when standing or ambulating.

## 2019-02-07 NOTE — Progress Notes (Signed)
[facility] PROGRESS NOTE    Progress Note  Kirk Montes  [id number]:[id number] DOB: 07-Jan-1946 DOA: 02/01/2019 PCP: No primary care provider on file.     Brief Narrative:   Kirk Montes is an 73 y.o. male past medical history of GERD, essential hypertension, prostate cancer and mantle cell lymphoma presents from [place] with progressive shortness of breath, he tested positive for COVID-19 on 12/26/2018 during this time he had mild symptoms and discharged home.  Was re-evaluated at [facility] on 01/08/2019 for headache and discharged home.  Returns to [facility] on 01/19/2019 for shortness of breath and was found to be hypoxic on supplemental oxygen.  Was admitted for 7 days he was treated with a 5-day course of IV remdesivir and Decadron for 10 days.  He was discharged home with supplemental oxygen with goals to wean off oxygen as an outpatient.  He returns on the day of admission on 02/12/2019 to the ER for worsening shortness of breath, he remained hypoxic and was transferred to [facility].  He has now nonproductive cough  Assessment/Plan:   Acute hypoxemic respiratory failure due to severe acute respiratory syndrome coronavirus 2 (SARS-CoV-2) disease (HCC)/ Pneumonia due to COVID-19 virus With a history of mantle cell lymphoma maintain his therapy. He has completed 10-day course of steroids as an outpatient. Today will be his last dose of IV remdesivir. He has been weaned to room air satting greater than 91%. Will ambulate patient and check saturations with ambulation. He can probably go home tomorrow.  Nonbloody diarrhea: C. difficile PCR was negative, GI PCR panel is negative. Related to COVID-19  Essential hypertension Continue atenolol. Blood pressure is fairly controlled.  Non Hodgkin's lymphoma ([place]) mantle cell lymphoma: Noted will need to follow-up with PCP as an outpatient.   DVT prophylaxis: lovenox Family  Communication:none Disposition Plan/Barrier to D/C: unable to determine Code Status:     Code Status Orders  (From admission, onward)         Start     Ordered   02/02/19 0004  Full code  Continuous     02/02/19 0005        Code Status History    This patient has a current code status but no historical code status.   Advance Care Planning Activity    Advance Directive Documentation     Most Recent Value  Type of Advance Directive  Healthcare Power of Attorney  Pre-existing out of facility DNR order (yellow form or pink MOST form)  -  "MOST" Form in Place?  -        IV Access:    Peripheral IV   Procedures and diagnostic studies:   No results found.   Medical Consultants:    None.  Anti-Infectives:   IV remdesivir. Subjective:    Kirk Montes continues to be in a good mood excited that he is going home off oxygen. Objective:    Vitals:   02/07/19 0100 02/07/19 0200 02/07/19 0300 02/07/19 0322  BP:    129/72  Pulse: 74 73 66 74  Resp: 20 16 (!) 22 (!) 25  Temp:    98.6 F (37 C)  TempSrc:    Oral  SpO2: 92% 93% 91% 91%  Weight:      Height:       SpO2: 91 % O2 Flow Rate (L/min): 1 L/min   Intake/Output Summary (Last 24 hours) at 02/07/2019 0819 Last data filed at 02/07/2019 0615 Gross per  24 hour  Intake 480 ml  Output 900 ml  Net -420 ml   Filed Weights   02/01/19 1200 02/03/19 1032  Weight: 86.2 kg 77.8 kg    Exam: General exam: In no acute distress. Respiratory system: Good air movement and crackles bilaterally Cardiovascular system: S1 & S2 heard, RRR. No JVD. Gastrointestinal system: Abdomen is nondistended, soft and nontender.  Central nervous system: Alert and oriented. No focal neurological deficits. Extremities: No pedal edema. Skin: No rashes, lesions or ulcers Psychiatry: Judgement and insight appear normal. Mood & affect appropriate.    Data Reviewed:    Labs: Basic Metabolic Panel: Recent Labs  Lab 02/03/19  0330 02/04/19 0615 02/05/19 0112 02/06/19 0007 02/07/19 0215  NA 132* 130* 133* 133* 135  K 4.6 4.1 4.5 4.5 4.5  CL 100 96* 96* 97* 99  CO2 '25 25 26 26 25  ' GLUCOSE 127* 82 108* 127* 109*  BUN 22 23 29* 29* 23  CREATININE 1.03 1.06 1.19 1.12 1.03  CALCIUM 8.7* 8.5* 8.3* 8.7* 8.6*   GFR Estimated Creatinine Clearance: 66 mL/min (by C-G formula based on SCr of 1.03 mg/dL). Liver Function Tests: Recent Labs  Lab 02/03/19 0330 02/04/19 0615 02/05/19 0112 02/06/19 0007 02/07/19 0215  AST 36 27 42* 33 30  ALT 109* 78* 84* 68* 64*  ALKPHOS 59 57 60 61 63  BILITOT 0.7 0.8 0.8 0.4 0.7  PROT 6.2* 5.9* 6.0* 5.8* 5.7*  ALBUMIN 2.7* 2.5* 2.5* 2.4* 2.5*   No results for input(s): LIPASE, AMYLASE in the last 168 hours. No results for input(s): AMMONIA in the last 168 hours. Coagulation profile Recent Labs  Lab 02/01/19 1230  INR 1.1   COVID-19 Labs  Recent Labs    02/05/19 0112 02/06/19 0007 02/07/19 0215  DDIMER 1.35* 0.93* 0.62*  FERRITIN 1,016* 1,179* 1,057*  CRP 14.6* 15.8* 15.4*    No results found for: SARSCOV2NAA  CBC: Recent Labs  Lab 02/03/19 0330 02/04/19 0615 02/05/19 0112 02/06/19 0007 02/07/19 0215  WBC 6.8 5.8 5.7 5.0 5.0  NEUTROABS 6.4 5.4 5.2 4.5 4.2  HGB 11.5* 10.8* 10.8* 10.2* 10.1*  HCT 34.9* 32.9* 33.0* 31.7* 30.8*  MCV 96.1 94.3 96.2 95.8 94.5  PLT 181 191 187 186 198   Cardiac Enzymes: No results for input(s): CKTOTAL, CKMB, CKMBINDEX, TROPONINI in the last 168 hours. BNP (last 3 results) No results for input(s): PROBNP in the last 8760 hours. CBG: No results for input(s): GLUCAP in the last 168 hours. D-Dimer: Recent Labs    02/06/19 0007 02/07/19 0215  DDIMER 0.93* 0.62*   Hgb A1c: No results for input(s): HGBA1C in the last 72 hours. Lipid Profile: No results for input(s): CHOL, HDL, LDLCALC, TRIG, CHOLHDL, LDLDIRECT in the last 72 hours. Thyroid function studies: No results for input(s): TSH, T4TOTAL, T3FREE, THYROIDAB  in the last 72 hours.  Invalid input(s): FREET3 Anemia work up: Recent Labs    02/06/19 0007 02/07/19 0215  FERRITIN 1,179* 1,057*   Sepsis Labs: Recent Labs  Lab 02/01/19 1230 02/01/19 1430  02/04/19 0615 02/05/19 0112 02/06/19 0007 02/07/19 0215  PROCALCITON 0.36  --   --   --   --   --   --   WBC 6.3  --    < > 5.8 5.7 5.0 5.0  LATICACIDVEN 2.1* 1.3  --   --   --   --   --    < > = values in this interval not displayed.   Microbiology Recent  Results (from the past 240 hour(s))  Blood Culture (routine x 2)     Status: None   Collection Time: 02/01/19 12:30 PM   Specimen: BLOOD  Result Value Ref Range Status   Specimen Description   Final    BLOOD LEFT ANTECUBITAL Performed at [facility], [address] [place], [place] [address]    Special Requests   Final    BOTTLES DRAWN AEROBIC AND ANAEROBIC Blood Culture adequate volume Performed at [facility], [address] [place], [place] [address]    Culture   Final    NO GROWTH 5 DAYS Performed at [facility], [place] [address] [place], [place] [address]    Report Status 02/06/2019 FINAL  Final  Urine culture     Status: None   Collection Time: 02/01/19 12:30 PM   Specimen: In/Out Cath Urine  Result Value Ref Range Status   Specimen Description   Final    IN/OUT CATH URINE Performed at [facility], [address] [place], [place] [address]    Special Requests   Final    NONE Performed at [facility], [address] [place], [place] [address]    Culture   Final    NO GROWTH Performed at [facility], [place] [address], [place], [place] [address]    Report Status 02/02/2019 FINAL  Final  SARS Coronavirus 2 Ag (30 min TAT) - Nasal Swab (BD Veritor Kit)     Status: Abnormal   Collection Time: 02/01/19 12:30 PM   Specimen: Nasal Swab (BD Veritor Kit)  Result Value Ref Range Status   SARS Coronavirus 2 Ag POSITIVE (A) NEGATIVE Final    Comment: RESULT  CALLED TO, READ BACK BY AND VERIFIED WITH: CALLED TO S.COBLE RN AT 2248 ON [id number] BY SROY (NOTE) SARS-CoV-2 antigen PRESENT. Positive results indicate the presence of viral antigens, but clinical correlation with patient history and other diagnostic information is necessary to determine patient infection status.  Positive results do not rule out bacterial infection or co-infection  with other viruses. False positive results are rare but can occur, and confirmatory RT-PCR testing may be appropriate in some circumstances. The expected result is Negative. Fact Sheet for Patients: PodPark.tn Fact Sheet for Providers: GiftContent.is  This test is not yet approved or cleared by the [place] FDA and  has been authorized for detection and/or diagnosis of SARS-CoV-2 by FDA under an Emergency Use Authorization (EUA).  This EUA will remain in effect (meaning this test can be used) for the duration of  the COVID -19 declaration under Section 564(b)(1) of the Act, 21 U.S.C. section 360bbb-3(b)(1), unless the authorization is terminated or revoked sooner. Performed at [facility], [address] [place], [place] [address]   Blood Culture (routine x 2)     Status: None   Collection Time: 02/01/19 12:31 PM   Specimen: BLOOD  Result Value Ref Range Status   Specimen Description   Final    BLOOD RIGHT ANTECUBITAL Performed at [facility], [address] [place], [place] [address]    Special Requests   Final    BOTTLES DRAWN AEROBIC AND ANAEROBIC Blood Culture adequate volume Performed at [facility], [address] [place], [place] [address]    Culture   Final    NO GROWTH 5 DAYS Performed at [facility], [place]  [address], [place], [place] [address]    Report Status 02/06/2019 FINAL  Final  C difficile quick scan w PCR reflex     Status: None   Collection Time: 02/02/19  6:55 AM    Specimen: STOOL  Result Value Ref Range Status   C Diff antigen NEGATIVE NEGATIVE Final   C Diff toxin NEGATIVE NEGATIVE Final   C Diff interpretation No C. difficile detected.  Final    Comment: Performed at [facility], [place] [address] [place], [place] [address]  Gastrointestinal Panel by PCR , Stool     Status: None   Collection Time: 02/02/19  6:55 AM   Specimen: Stool  Result Value Ref Range Status   Campylobacter species NOT DETECTED NOT DETECTED Final   Plesimonas shigelloides NOT DETECTED NOT DETECTED Final   Salmonella species NOT DETECTED NOT DETECTED Final   Yersinia enterocolitica NOT DETECTED NOT DETECTED Final   Vibrio species NOT DETECTED NOT DETECTED Final   Vibrio cholerae NOT DETECTED NOT DETECTED Final   Enteroaggregative E coli (EAEC) NOT DETECTED NOT DETECTED Final   Enteropathogenic E coli (EPEC) NOT DETECTED NOT DETECTED Final   Enterotoxigenic E coli (ETEC) NOT DETECTED NOT DETECTED Final   Shiga like toxin producing E coli (STEC) NOT DETECTED NOT DETECTED Final   Shigella/Enteroinvasive E coli (EIEC) NOT DETECTED NOT DETECTED Final   Cryptosporidium NOT DETECTED NOT DETECTED Final   Cyclospora cayetanensis NOT DETECTED NOT DETECTED Final   Entamoeba histolytica NOT DETECTED NOT DETECTED Final   Giardia lamblia NOT DETECTED NOT DETECTED Final   Adenovirus F40/41 NOT DETECTED NOT DETECTED Final   Astrovirus NOT DETECTED NOT DETECTED Final   Norovirus GI/GII NOT DETECTED NOT DETECTED Final   Rotavirus A NOT DETECTED NOT DETECTED Final   Sapovirus (I, II, IV, and V) NOT DETECTED NOT DETECTED Final    Comment: Performed at [facility], [address] [place], [place] [address]  MRSA PCR Screening     Status: None   Collection Time: 02/02/19  3:00 PM   Specimen: Nasopharyngeal  Result Value Ref Range Status   MRSA by PCR NEGATIVE NEGATIVE Final    Comment:        The GeneXpert MRSA Assay (FDA approved for NASAL specimens only),  is one component of a comprehensive MRSA colonization surveillance program. It is not intended to diagnose MRSA infection nor to guide or monitor treatment for MRSA infections. Performed at [facility], [place] [address] [place], [place] [address]      Medications:   . allopurinol  300 mg Oral Daily  . atenolol  25 mg Oral BID  . atorvastatin  10 mg Oral q1800  . docusate sodium  100 mg Oral Daily  . enoxaparin (LOVENOX) injection  40 mg Subcutaneous Q24H  . fluticasone  2 spray Each Nare Daily  . hydrOXYzine  25 mg Oral BID  . Ipratropium-Albuterol  1 puff Inhalation Q6H  . multivitamin with minerals  1 tablet Oral Daily  . pantoprazole  40 mg Oral BID  . sertraline  50 mg Oral Daily  . tamsulosin  0.4 mg Oral QPC supper  . vitamin C  500 mg Oral Daily  . zinc sulfate  220 mg Oral Daily   Continuous Infusions: . sodium chloride Stopped (02/01/19 2123)  . sodium chloride Stopped (02/01/19 1450)  . remdesivir 100 mg in NS 100 mL        LOS: 6 days   Charlynne Cousins  [facility]  02/07/2019, 8:19 AM

## 2019-02-08 DIAGNOSIS — C8590 Non-Hodgkin lymphoma, unspecified, unspecified site: Secondary | ICD-10-CM

## 2019-02-08 LAB — C-REACTIVE PROTEIN: CRP: 14.2 mg/dL — ABNORMAL HIGH (ref <1.0)

## 2019-02-08 LAB — D-DIMER, QUANTITATIVE: D-Dimer, Quant: 0.76 ug/mL-FEU — ABNORMAL HIGH (ref 0.00–0.50)

## 2019-02-08 NOTE — Care Management Important Message (Signed)
Important Message  Patient Details  Name: Kirk Montes MRN: [id number] Date of Birth: [birth year]   Medicare Important Message Given:  Yes - Important Message mailed due to current National Emergency  Verbal consent obtained due to current National Emergency  Relationship to patient: Spouse/Significant Other Contact Name: Vibhu Reymundo Call Date: 02/08/19  Time: 1133 Phone: [phone] Outcome: Spoke with contact Important Message mailed to: Patient address on file    Delorse Lek 02/08/2019, 11:34 AM

## 2019-02-08 NOTE — Plan of Care (Signed)
  Problem: Education: Goal: Knowledge of General Education information will improve Description Including pain rating scale, medication(s)/side effects and non-pharmacologic comfort measures Outcome: Progressing   

## 2019-02-08 NOTE — Discharge Summary (Signed)
Physician Discharge Summary  Kirk Montes BSJ:[id number] DOB: 1945-10-02 DOA: 02/01/2019  PCP: No primary care provider on file.  Admit date: 02/01/2019 Discharge date: 02/08/2019  Admitted From: Home Disposition:  Home  Recommendations for Outpatient Follow-up:  1. Follow up with PCP in 1-2 weeks 2. Please obtain BMP/CBC in one week 3.   Home Health:No Equipment/Devices:None  Discharge Condition:Stable CODE STATUS:Full Diet recommendation: Heart Healthy   Brief/Interim Summary: 73 y.o. male past medical history of GERD, essential hypertension, prostate cancer and mantle cell lymphoma presents from [place] with progressive shortness of breath, he tested positive for COVID-19 on 12/26/2018 during this time he had mild symptoms and discharged home.  Was re-evaluated at [facility] on 01/08/2019 for headache and discharged home.  Returns to [facility] on 01/19/2019 for shortness of breath and was found to be hypoxic on supplemental oxygen.  Was admitted for 7 days he was treated with a 5-day course of IV remdesivir and Decadron for 10 days.  He was discharged home with supplemental oxygen with goals to wean off oxygen as an outpatient.  He returns on the day of admission on 02/12/2019 to the ER for worsening shortness of breath, he remained hypoxic and was transferred to [facility].  He has now nonproductive cough  Discharge Diagnoses:  Principal Problem:   Acute hypoxemic respiratory failure due to severe acute respiratory syndrome coronavirus 2 (SARS-CoV-2) disease ([place]) Active Problems:   Pneumonia due to COVID-19 virus   Essential hypertension   Non Hodgkin's lymphoma ([place]) Acute respiratory failure with hypoxia secondary to COVID-19 pneumonia: This is positive for SARS-CoV-2 on 01/26/2019 his condition progressed and was admitted to [facility] with respiratory failure he completed a 5-day course of IV remdesivir and a 10-day course  of steroids and outpatient.  He was discharged home on 2 L came back to the hospital at [facility]. He was started on IV remdesivir for which she completed his 10-day course he was weaned to room air and was in stable condition.  Nonbloody diarrhea: C. difficile PCR was negative, GI PCR panel was also negative. Likely related to COVID-19.  Essential hypertension: No changes made to his medication.  Non-Hodgkin's lymphoma follow-up with oncology as an outpatient.   Discharge Instructions  Discharge Instructions    Diet - low sodium heart healthy   Complete by: As directed    Increase activity slowly   Complete by: As directed      Allergies as of 02/08/2019      Reactions   Ivp Dye [iodinated Diagnostic Agents] Shortness Of Breath   Sulfamethoxazole Hives      Medication List    TAKE these medications   acetaminophen 500 MG tablet Commonly known as: TYLENOL Take 500-1,000 mg by mouth every 6 (six) hours as needed for mild pain, fever or headache.   albuterol 108 (90 Base) MCG/ACT inhaler Commonly known as: VENTOLIN HFA Inhale 2 puffs into the lungs every 4 (four) hours as needed.   allopurinol 300 MG tablet Commonly known as: ZYLOPRIM Take 300 mg by mouth daily.   atenolol 50 MG tablet Commonly known as: TENORMIN Take 50 mg by mouth 2 (two) times daily.   atorvastatin 10 MG tablet Commonly known as: LIPITOR Take 10 mg by mouth daily at 6 PM.   benzonatate 100 MG capsule Commonly known as: TESSALON Take 100 mg by mouth 4 (four) times daily.   calcium carbonate 1500 (600 Ca) MG Tabs tablet Commonly  known as: OSCAL Take 2 tablets by mouth daily with breakfast.   dexamethasone 4 MG tablet Commonly known as: DECADRON Take 6 mg by mouth every morning. For 5 days Course started on 11.24.2020   diphenoxylate-atropine 2.5-0.025 MG tablet Commonly known as: LOMOTIL Take 2-3 tablets by mouth daily.   doxycycline 100 MG capsule Commonly known as:  VIBRAMYCIN Take 100 mg by mouth 2 (two) times daily. For one day Course started on 11.24.2020   fluticasone 50 MCG/ACT nasal spray Commonly known as: FLONASE Place 2 sprays into both nostrils daily.   GLUCOSAMINE CHOND DOUBLE STR PO Take 1 tablet by mouth daily.   hydrOXYzine 25 MG capsule Commonly known as: VISTARIL Take 25 mg by mouth 2 (two) times daily.   metroNIDAZOLE 0.75 % cream Commonly known as: METROCREAM Apply 1 application topically 2 (two) times daily as needed (roseca).   multivitamin with minerals Tabs tablet Take 1 tablet by mouth daily.   pantoprazole 40 MG tablet Commonly known as: PROTONIX Take 40 mg by mouth 2 (two) times daily.   sertraline 100 MG tablet Commonly known as: ZOLOFT Take 50 mg by mouth daily.   tamsulosin 0.4 MG Caps capsule Commonly known as: FLOMAX Take 0.4 mg by mouth daily after supper.       Allergies  Allergen Reactions  . Ivp Dye [Iodinated Diagnostic Agents] Shortness Of Breath  . Sulfamethoxazole Hives    Consultations:  None   Procedures/Studies: Dg Chest Port 1 View  Result Date: 02/01/2019 CLINICAL DATA:  Shortness of breath, sepsis.  COVID (+) EXAM: PORTABLE CHEST 1 VIEW COMPARISON:  Chest radiograph 01/21/2019, CT chest 01/19/2019 FINDINGS: Unchanged position of a right chest infusion port catheter with tip projecting over the superior cavoatrial junction. Overlying cardiac monitoring leads. Heart size within normal limits. Shallow inspiration radiograph. Again demonstrated are bilateral ill-defined airspace opacities within mid to lower lung predominance. No evidence of pneumothorax or sizable pleural effusion. No acute bony abnormality. IMPRESSION: Redemonstrated bilateral ill-defined airspace opacities with a mid to lower lung predominance. Findings likely reflect multifocal pneumonia given provided history. Electronically Signed   By: Kellie Simmering DO   On: 02/01/2019 12:59      Subjective: [place] feels  great.  Discharge Exam: Vitals:   02/08/19 0344 02/08/19 0803  BP: 140/71 (!) 143/78  Pulse:  75  Resp:  20  Temp: 98.1 F (36.7 C)   SpO2: 93% 93%   Vitals:   02/07/19 1600 02/07/19 1950 02/08/19 0344 02/08/19 0803  BP: 130/64 115/63 140/71 (!) 143/78  Pulse: 74 91  75  Resp: '18 20  20  ' Temp: 98.1 F (36.7 C) 99 F (37.2 C) 98.1 F (36.7 C)   TempSrc: Oral Oral Oral   SpO2: 95% 93% 93% 93%  Weight:      Height:        General: Pt is alert, awake, not in acute distress Cardiovascular: RRR, S1/S2 +, no rubs, no gallops Respiratory: CTA bilaterally, no wheezing, no rhonchi Abdominal: Soft, NT, ND, bowel sounds + Extremities: no edema, no cyanosis    The results of significant diagnostics from this hospitalization (including imaging, microbiology, ancillary and laboratory) are listed below for reference.     Microbiology: Recent Results (from the past 240 hour(s))  Blood Culture (routine x 2)     Status: None   Collection Time: 02/01/19 12:30 PM   Specimen: BLOOD  Result Value Ref Range Status   Specimen Description   Final    BLOOD LEFT  ANTECUBITAL Performed at [facility], [address] [place], [place] [address]    Special Requests   Final    BOTTLES DRAWN AEROBIC AND ANAEROBIC Blood Culture adequate volume Performed at [facility], [address] [place], [place] [address]    Culture   Final    NO GROWTH 5 DAYS Performed at [facility], [place] [address], [place], [place] [address]    Report Status 02/06/2019 FINAL  Final  Urine culture     Status: None   Collection Time: 02/01/19 12:30 PM   Specimen: In/Out Cath Urine  Result Value Ref Range Status   Specimen Description   Final    IN/OUT CATH URINE Performed at [facility], [address] [place], [place] [address]    Special Requests   Final    NONE Performed at [facility], [address] [place], [place] [address]    Culture   Final     NO GROWTH Performed at [facility], [place] [address], [place], [place] [address]    Report Status 02/02/2019 FINAL  Final  SARS Coronavirus 2 Ag (30 min TAT) - Nasal Swab (BD Veritor Kit)     Status: Abnormal   Collection Time: 02/01/19 12:30 PM   Specimen: Nasal Swab (BD Veritor Kit)  Result Value Ref Range Status   SARS Coronavirus 2 Ag POSITIVE (A) NEGATIVE Final    Comment: RESULT CALLED TO, READ BACK BY AND VERIFIED WITH: CALLED TO S.COBLE RN AT [id number] ON [id number] BY SROY (NOTE) SARS-CoV-2 antigen PRESENT. Positive results indicate the presence of viral antigens, but clinical correlation with patient history and other diagnostic information is necessary to determine patient infection status.  Positive results do not rule out bacterial infection or co-infection  with other viruses. False positive results are rare but can occur, and confirmatory RT-PCR testing may be appropriate in some circumstances. The expected result is Negative. Fact Sheet for Patients: PodPark.tn Fact Sheet for Providers: GiftContent.is  This test is not yet approved or cleared by the [place] FDA and  has been authorized for detection and/or diagnosis of SARS-CoV-2 by FDA under an Emergency Use Authorization (EUA).  This EUA will remain in effect (meaning this test can be used) for the duration of  the COVID -19 declaration under Section 564(b)(1) of the Act, 21 U.S.C. section 360bbb-3(b)(1), unless the authorization is terminated or revoked sooner. Performed at [facility], [address] [place], [place] [address]   Blood Culture (routine x 2)     Status: None   Collection Time: 02/01/19 12:31 PM   Specimen: BLOOD  Result Value Ref Range Status   Specimen Description   Final    BLOOD RIGHT ANTECUBITAL Performed at [facility], [address] [place], [place] [address]    Special Requests   Final    BOTTLES  DRAWN AEROBIC AND ANAEROBIC Blood Culture adequate volume Performed at [facility], [address] [place], [place] [address]    Culture   Final    NO GROWTH 5 DAYS Performed at [facility], [place] [address] [place], [place] [address]    Report Status 02/06/2019 FINAL  Final  C difficile quick scan w PCR reflex     Status: None   Collection Time: 02/02/19  6:55 AM   Specimen: STOOL  Result Value Ref Range Status  C Diff antigen NEGATIVE NEGATIVE Final   C Diff toxin NEGATIVE NEGATIVE Final   C Diff interpretation No C. difficile detected.  Final    Comment: Performed at [facility], [place] [address], [place], [place] [address]  Gastrointestinal Panel by PCR , Stool     Status: None   Collection Time: 02/02/19  6:55 AM   Specimen: Stool  Result Value Ref Range Status   Campylobacter species NOT DETECTED NOT DETECTED Final   Plesimonas shigelloides NOT DETECTED NOT DETECTED Final   Salmonella species NOT DETECTED NOT DETECTED Final   Yersinia enterocolitica NOT DETECTED NOT DETECTED Final   Vibrio species NOT DETECTED NOT DETECTED Final   Vibrio cholerae NOT DETECTED NOT DETECTED Final   Enteroaggregative E coli (EAEC) NOT DETECTED NOT DETECTED Final   Enteropathogenic E coli (EPEC) NOT DETECTED NOT DETECTED Final   Enterotoxigenic E coli (ETEC) NOT DETECTED NOT DETECTED Final   Shiga like toxin producing E coli (STEC) NOT DETECTED NOT DETECTED Final   Shigella/Enteroinvasive E coli (EIEC) NOT DETECTED NOT DETECTED Final   Cryptosporidium NOT DETECTED NOT DETECTED Final   Cyclospora cayetanensis NOT DETECTED NOT DETECTED Final   Entamoeba histolytica NOT DETECTED NOT DETECTED Final   Giardia lamblia NOT DETECTED NOT DETECTED Final   Adenovirus F40/41 NOT DETECTED NOT DETECTED Final   Astrovirus NOT DETECTED NOT DETECTED Final   Norovirus GI/GII NOT DETECTED NOT DETECTED Final   Rotavirus A NOT DETECTED NOT DETECTED Final   Sapovirus (I, II,  IV, and V) NOT DETECTED NOT DETECTED Final    Comment: Performed at [facility], [address] [place], [place] [address]  MRSA PCR Screening     Status: None   Collection Time: 02/02/19  3:00 PM   Specimen: Nasopharyngeal  Result Value Ref Range Status   MRSA by PCR NEGATIVE NEGATIVE Final    Comment:        The GeneXpert MRSA Assay (FDA approved for NASAL specimens only), is one component of a comprehensive MRSA colonization surveillance program. It is not intended to diagnose MRSA infection nor to guide or monitor treatment for MRSA infections. Performed at [facility], [place] [address] [place], [place] [address]      Labs: BNP (last 3 results) Recent Labs    02/01/19 1230  BNP 742.5*   Basic Metabolic Panel: Recent Labs  Lab 02/03/19 0330 02/04/19 0615 02/05/19 0112 02/06/19 0007 02/07/19 0215  NA 132* 130* 133* 133* 135  K 4.6 4.1 4.5 4.5 4.5  CL 100 96* 96* 97* 99  CO2 '25 25 26 26 25  ' GLUCOSE 127* 82 108* 127* 109*  BUN 22 23 29* 29* 23  CREATININE 1.03 1.06 1.19 1.12 1.03  CALCIUM 8.7* 8.5* 8.3* 8.7* 8.6*   Liver Function Tests: Recent Labs  Lab 02/03/19 0330 02/04/19 0615 02/05/19 0112 02/06/19 0007 02/07/19 0215  AST 36 27 42* 33 30  ALT 109* 78* 84* 68* 64*  ALKPHOS 59 57 60 61 63  BILITOT 0.7 0.8 0.8 0.4 0.7  PROT 6.2* 5.9* 6.0* 5.8* 5.7*  ALBUMIN 2.7* 2.5* 2.5* 2.4* 2.5*   No results for input(s): LIPASE, AMYLASE in the last 168 hours. No results for input(s): AMMONIA in the last 168 hours. CBC: Recent Labs  Lab 02/03/19 0330 02/04/19 0615 02/05/19 0112 02/06/19 0007 02/07/19 0215  WBC 6.8 5.8 5.7 5.0 5.0  NEUTROABS 6.4 5.4 5.2 4.5 4.2  HGB 11.5* 10.8* 10.8* 10.2* 10.1*  HCT 34.9*  32.9* 33.0* 31.7* 30.8*  MCV 96.1 94.3 96.2 95.8 94.5  PLT 181 191 187 186 198   Cardiac Enzymes: No results for input(s): CKTOTAL, CKMB, CKMBINDEX, TROPONINI in the last 168 hours. BNP: Invalid input(s):  POCBNP CBG: No results for input(s): GLUCAP in the last 168 hours. D-Dimer Recent Labs    02/07/19 0215 02/08/19 0130  DDIMER 0.62* 0.76*   Hgb A1c No results for input(s): HGBA1C in the last 72 hours. Lipid Profile No results for input(s): CHOL, HDL, LDLCALC, TRIG, CHOLHDL, LDLDIRECT in the last 72 hours. Thyroid function studies No results for input(s): TSH, T4TOTAL, T3FREE, THYROIDAB in the last 72 hours.  Invalid input(s): FREET3 Anemia work up Recent Labs    02/06/19 0007 02/07/19 0215  FERRITIN 1,179* 1,057*   Urinalysis    Component Value Date/Time   COLORURINE YELLOW 02/01/2019 1230   APPEARANCEUR CLEAR 02/01/2019 1230   LABSPEC 1.015 02/01/2019 1230   PHURINE 5.5 02/01/2019 1230   GLUCOSEU NEGATIVE 02/01/2019 1230   HGBUR NEGATIVE 02/01/2019 1230   BILIRUBINUR NEGATIVE 02/01/2019 1230   KETONESUR NEGATIVE 02/01/2019 1230   PROTEINUR NEGATIVE 02/01/2019 1230   NITRITE NEGATIVE 02/01/2019 1230   LEUKOCYTESUR NEGATIVE 02/01/2019 1230   Sepsis Labs Invalid input(s): PROCALCITONIN,  WBC,  LACTICIDVEN Microbiology Recent Results (from the past 240 hour(s))  Blood Culture (routine x 2)     Status: None   Collection Time: 02/01/19 12:30 PM   Specimen: BLOOD  Result Value Ref Range Status   Specimen Description   Final    BLOOD LEFT ANTECUBITAL Performed at [facility], [address] [place], [place] [address]    Special Requests   Final    BOTTLES DRAWN AEROBIC AND ANAEROBIC Blood Culture adequate volume Performed at [facility], [address] [place], [place] [address]    Culture   Final    NO GROWTH 5 DAYS Performed at [facility], [place] [address], [place], [place] [address]    Report Status 02/06/2019 FINAL  Final  Urine culture     Status: None   Collection Time: 02/01/19 12:30 PM   Specimen: In/Out Cath Urine  Result Value Ref Range Status   Specimen Description   Final    IN/OUT CATH URINE Performed at [facility], [address] [place], [place] [address]    Special Requests   Final    NONE Performed at [facility], [address] [place], [place] [address]    Culture   Final    NO GROWTH Performed at [facility], [place] [address] [place], [place] [address]    Report Status 02/02/2019 FINAL  Final  SARS Coronavirus 2 Ag (30 min TAT) - Nasal Swab (BD Veritor Kit)     Status: Abnormal   Collection Time: 02/01/19 12:30 PM   Specimen: Nasal Swab (BD Veritor Kit)  Result Value Ref Range Status   SARS Coronavirus 2 Ag POSITIVE (A) NEGATIVE Final    Comment: RESULT CALLED TO, READ BACK BY AND VERIFIED WITH: CALLED TO S.COBLE RN AT 8264 ON 113020 BY SROY (NOTE) SARS-CoV-2 antigen PRESENT. Positive results indicate the presence of viral antigens, but clinical correlation with patient history and other diagnostic information is necessary to determine patient infection status.  Positive results do not rule out bacterial infection or co-infection  with other viruses. False positive results are rare but can occur, and confirmatory RT-PCR testing may be  appropriate in some circumstances. The expected result is Negative. Fact Sheet for Patients: PodPark.tn Fact Sheet for Providers: GiftContent.is  This test is not yet approved or cleared by the [place] FDA and  has been authorized for detection and/or diagnosis of SARS-CoV-2 by FDA under an Emergency Use Authorization (EUA).  This EUA will remain in effect (meaning this test can be used) for the duration of  the COVID -19 declaration under Section 564(b)(1) of the Act, 21 U.S.C. section 360bbb-3(b)(1), unless the authorization is terminated or revoked sooner. Performed at [facility], [address] [place], [place] [address]   Blood Culture (routine x 2)     Status: None   Collection Time: 02/01/19 12:31 PM   Specimen: BLOOD   Result Value Ref Range Status   Specimen Description   Final    BLOOD RIGHT ANTECUBITAL Performed at [facility], [address] [place], [place] [address]    Special Requests   Final    BOTTLES DRAWN AEROBIC AND ANAEROBIC Blood Culture adequate volume Performed at [facility], [address] [place], [place] [address]    Culture   Final    NO GROWTH 5 DAYS Performed at [facility], [place] [address], [place], [place] [address]    Report Status 02/06/2019 FINAL  Final  C difficile quick scan w PCR reflex     Status: None   Collection Time: 02/02/19  6:55 AM   Specimen: STOOL  Result Value Ref Range Status   C Diff antigen NEGATIVE NEGATIVE Final   C Diff toxin NEGATIVE NEGATIVE Final   C Diff interpretation No C. difficile detected.  Final    Comment: Performed at [facility], [place] [address], [place], [place] [address]  Gastrointestinal Panel by PCR , Stool     Status: None   Collection Time: 02/02/19  6:55 AM   Specimen: Stool  Result Value Ref Range Status   Campylobacter species NOT DETECTED NOT DETECTED Final   Plesimonas shigelloides NOT DETECTED NOT DETECTED Final   Salmonella species NOT DETECTED NOT DETECTED Final   Yersinia enterocolitica NOT DETECTED NOT DETECTED Final   Vibrio species NOT DETECTED NOT DETECTED Final   Vibrio cholerae NOT DETECTED NOT DETECTED Final   Enteroaggregative E coli (EAEC) NOT DETECTED NOT DETECTED Final   Enteropathogenic E coli (EPEC) NOT DETECTED NOT DETECTED Final   Enterotoxigenic E coli (ETEC) NOT DETECTED NOT DETECTED Final   Shiga like toxin producing E coli (STEC) NOT DETECTED NOT DETECTED Final   Shigella/Enteroinvasive E coli (EIEC) NOT DETECTED NOT DETECTED Final   Cryptosporidium NOT DETECTED NOT DETECTED Final   Cyclospora cayetanensis NOT DETECTED NOT DETECTED Final   Entamoeba histolytica NOT DETECTED NOT DETECTED Final   Giardia lamblia NOT DETECTED NOT DETECTED Final    Adenovirus F40/41 NOT DETECTED NOT DETECTED Final   Astrovirus NOT DETECTED NOT DETECTED Final   Norovirus GI/GII NOT DETECTED NOT DETECTED Final   Rotavirus A NOT DETECTED NOT DETECTED Final   Sapovirus (I, II, IV, and V) NOT DETECTED NOT DETECTED Final    Comment: Performed at [facility], [address] [place], [place] [address]  MRSA PCR Screening     Status: None   Collection Time: 02/02/19  3:00 PM   Specimen: Nasopharyngeal  Result Value Ref Range Status   MRSA by PCR NEGATIVE NEGATIVE Final    Comment:  The GeneXpert MRSA Assay (FDA approved for NASAL specimens only), is one component of a comprehensive MRSA colonization surveillance program. It is not intended to diagnose MRSA infection nor to guide or monitor treatment for MRSA infections. Performed at [facility], [place] [address] [place], [place] [address]      Time coordinating discharge: Over 40 minutes  SIGNED:   Charlynne Cousins, MD  [facility] 02/08/2019, 10:27 AM Pager   If 7PM-7AM, please contact night-coverage [email or web address] Password [email or web address]

## 2020-10-06 IMAGING — DX DG CHEST 1V PORT
1 series · 1 of 1 positions shown · non-contrast
Comparison: Chest radiograph 01/21/2019, CT chest 01/19/2019

CLINICAL DATA: Shortness of breath, sepsis.  COVID (+)

EXAM:
PORTABLE CHEST 1 VIEW

[chest ap]
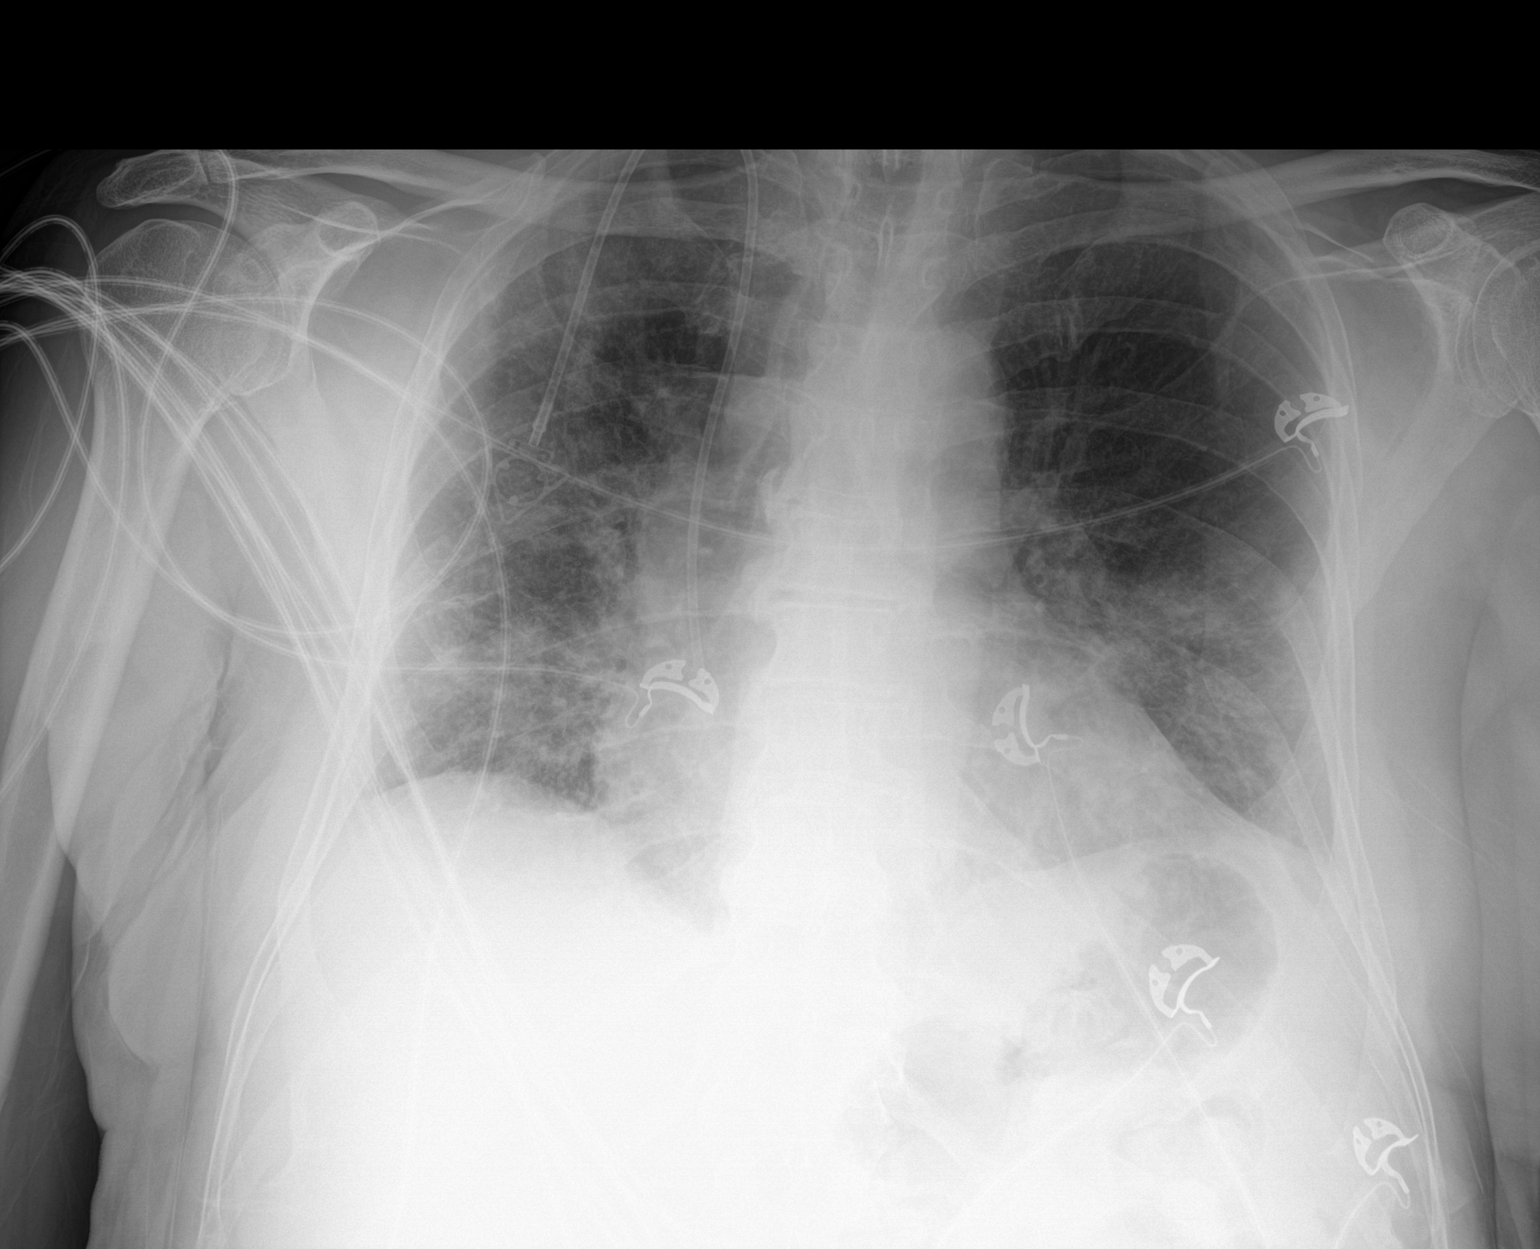

[1 of 1 positions shown; findings below may reference images not displayed]

FINDINGS: Unchanged position of a right chest infusion port catheter with tip
projecting over the superior cavoatrial junction. Overlying cardiac
monitoring leads.

Heart size within normal limits.

Shallow inspiration radiograph. Again demonstrated are bilateral
ill-defined airspace opacities within mid to lower lung
predominance. No evidence of pneumothorax or sizable pleural
effusion. No acute bony abnormality.
IMPRESSION: Redemonstrated bilateral ill-defined airspace opacities with a mid
to lower lung predominance. Findings likely reflect multifocal
pneumonia given provided history.
